# Patient Record
Sex: Female | Born: 1955 | Race: White | Hispanic: No | Marital: Married | Smoking: Never smoker
Health system: Southern US, Community
[De-identification: ages and names within clinical notes are randomized; demographics above are authoritative.]

## PROBLEM LIST (undated history)

## (undated) DIAGNOSIS — M7660 Achilles tendinitis, unspecified leg: Secondary | ICD-10-CM

## (undated) DIAGNOSIS — K219 Gastro-esophageal reflux disease without esophagitis: Secondary | ICD-10-CM

## (undated) DIAGNOSIS — D649 Anemia, unspecified: Secondary | ICD-10-CM

## (undated) HISTORY — DX: Gastro-esophageal reflux disease without esophagitis: K21.9

## (undated) HISTORY — PX: SHOULDER ARTHROSCOPY: SHX128

## (undated) HISTORY — DX: Anemia, unspecified: D64.9

## (undated) HISTORY — PX: OTHER SURGICAL HISTORY: SHX169

## (undated) HISTORY — DX: Achilles tendinitis, unspecified leg: M76.60

## (undated) HISTORY — PX: TENDON REPAIR: SHX5111

---

## 1999-05-04 ENCOUNTER — Encounter: Admission: RE | Admit: 1999-05-04 | Discharge: 1999-05-04 | Payer: Self-pay | Admitting: Family Medicine

## 1999-05-04 ENCOUNTER — Encounter: Payer: Self-pay | Admitting: Family Medicine

## 1999-08-26 ENCOUNTER — Encounter: Admission: RE | Admit: 1999-08-26 | Discharge: 1999-08-26 | Payer: Self-pay | Admitting: Internal Medicine

## 1999-08-26 ENCOUNTER — Encounter: Payer: Self-pay | Admitting: Internal Medicine

## 2001-08-05 ENCOUNTER — Encounter: Payer: Self-pay | Admitting: Internal Medicine

## 2001-08-05 ENCOUNTER — Encounter: Admission: RE | Admit: 2001-08-05 | Discharge: 2001-08-05 | Payer: Self-pay | Admitting: Internal Medicine

## 2004-04-25 ENCOUNTER — Ambulatory Visit: Payer: Self-pay | Admitting: Internal Medicine

## 2004-05-02 ENCOUNTER — Ambulatory Visit: Payer: Self-pay

## 2004-08-11 ENCOUNTER — Encounter: Admission: RE | Admit: 2004-08-11 | Discharge: 2004-08-11 | Payer: Self-pay | Admitting: Internal Medicine

## 2005-01-10 ENCOUNTER — Other Ambulatory Visit: Admission: RE | Admit: 2005-01-10 | Discharge: 2005-01-10 | Payer: Self-pay | Admitting: Obstetrics and Gynecology

## 2005-08-09 ENCOUNTER — Encounter: Admission: RE | Admit: 2005-08-09 | Discharge: 2005-08-09 | Payer: Self-pay | Admitting: Internal Medicine

## 2005-10-16 ENCOUNTER — Ambulatory Visit: Payer: Self-pay | Admitting: Family Medicine

## 2007-01-25 ENCOUNTER — Encounter: Payer: Self-pay | Admitting: Internal Medicine

## 2007-01-25 ENCOUNTER — Encounter: Admission: RE | Admit: 2007-01-25 | Discharge: 2007-01-25 | Payer: Self-pay | Admitting: Internal Medicine

## 2007-08-08 ENCOUNTER — Encounter: Admission: RE | Admit: 2007-08-08 | Discharge: 2007-08-08 | Payer: Self-pay | Admitting: Internal Medicine

## 2008-03-05 ENCOUNTER — Telehealth (INDEPENDENT_AMBULATORY_CARE_PROVIDER_SITE_OTHER): Payer: Self-pay | Admitting: *Deleted

## 2008-03-05 ENCOUNTER — Ambulatory Visit: Payer: Self-pay | Admitting: Sports Medicine

## 2008-03-05 DIAGNOSIS — M7660 Achilles tendinitis, unspecified leg: Secondary | ICD-10-CM | POA: Insufficient documentation

## 2008-03-05 HISTORY — DX: Achilles tendinitis, unspecified leg: M76.60

## 2008-03-06 ENCOUNTER — Encounter: Payer: Self-pay | Admitting: Sports Medicine

## 2008-03-06 ENCOUNTER — Telehealth (INDEPENDENT_AMBULATORY_CARE_PROVIDER_SITE_OTHER): Payer: Self-pay | Admitting: *Deleted

## 2008-04-10 ENCOUNTER — Ambulatory Visit: Payer: Self-pay | Admitting: Sports Medicine

## 2008-05-08 ENCOUNTER — Ambulatory Visit: Payer: Self-pay | Admitting: Sports Medicine

## 2008-06-08 ENCOUNTER — Telehealth (INDEPENDENT_AMBULATORY_CARE_PROVIDER_SITE_OTHER): Payer: Self-pay | Admitting: *Deleted

## 2008-06-12 ENCOUNTER — Ambulatory Visit: Payer: Self-pay | Admitting: Sports Medicine

## 2008-08-06 ENCOUNTER — Encounter: Admission: RE | Admit: 2008-08-06 | Discharge: 2008-08-06 | Payer: Self-pay | Admitting: Internal Medicine

## 2009-01-13 ENCOUNTER — Ambulatory Visit: Payer: Self-pay | Admitting: Sports Medicine

## 2009-02-25 ENCOUNTER — Ambulatory Visit: Payer: Self-pay | Admitting: Sports Medicine

## 2009-02-25 DIAGNOSIS — M7750 Other enthesopathy of unspecified foot: Secondary | ICD-10-CM | POA: Insufficient documentation

## 2009-02-25 DIAGNOSIS — M216X9 Other acquired deformities of unspecified foot: Secondary | ICD-10-CM | POA: Insufficient documentation

## 2009-02-25 DIAGNOSIS — R269 Unspecified abnormalities of gait and mobility: Secondary | ICD-10-CM | POA: Insufficient documentation

## 2009-04-12 ENCOUNTER — Ambulatory Visit: Payer: Self-pay | Admitting: Sports Medicine

## 2009-05-10 ENCOUNTER — Ambulatory Visit: Payer: Self-pay | Admitting: Sports Medicine

## 2009-06-23 ENCOUNTER — Ambulatory Visit: Payer: Self-pay | Admitting: Sports Medicine

## 2009-08-12 ENCOUNTER — Encounter: Admission: RE | Admit: 2009-08-12 | Discharge: 2009-08-12 | Payer: Self-pay | Admitting: Internal Medicine

## 2009-08-12 LAB — HM MAMMOGRAPHY: HM Mammogram: NEGATIVE

## 2010-03-06 ENCOUNTER — Encounter: Payer: Self-pay | Admitting: Internal Medicine

## 2010-03-15 NOTE — Assessment & Plan Note (Signed)
Summary: RT LEG/HEEL,MC   Vital Signs:  Patient profile:   55 year old female BP sitting:   105 / 73  Vitals Entered By: Lillia Pauls CMA (January 13, 2009 10:02 AM)  History of Present Illness: Reports to f/u achilles tendonosis. Left achilles tendonosis resolved with voltaren and NTG patches. Insidiously developed right achilles pain. Performing heel raises as instructed. Using comforthotics. Not sure that she did that much different she does travel and work out regularly she wonders if this is just not starting to arise from regular workouts  Allergies (verified): 1)  ! Ampicillin 2)  ! Erythromycin  Physical Exam  General:  Well-developed,well-nourished,in no acute distress; alert,appropriate and cooperative throughout examination Msk:  Ankles/Feet: - Slightly limited right dorsiflexion 2/2 AT tightness; otherwise FROM. 5/5 strength. - Bilateral Pes Cavus. - ttp at right AT insertion with mildly increased warmth.  No discoloration or defects. - Normal neurovascular exam. Additional Exam:  Musculoskeletal Ultrasound: Longitudinal and transverse views of the achilles tendon revealed the following: Integrity:  Thickness: Retrocalcaneal Bursa: Calcaneus:    Impression & Recommendations:  Problem # 1:  ACHILLES BURSITIS OR TENDINITIS (ICD-726.71)  - Per patient instructions. - RTC in 1 month.   if no improvement would go to NTG s she did well on this before  also - unable to scan today as computer diffculty will scan on [facility] visit if she gets a chance to come back by  Complete Medication List: 1)  Voltaren 1 % Gel (Diclofenac sodium) .... Aaa 4 grams qid 2)  Minitran 0.2 Mg/hr Pt24 (Nitroglycerin) .... 1/4 of a patch on affected area for 12 hours, then off for 12 hours  Patient Instructions: 1)  Daily Exercises: 2)  1. Heel raises.  3)  2. Heel walk. 4)  3. Reverse Heel Walk. 5)  4. Pidgeon-toe walk. 6)  Medications: 7)  1. Voltaren Gel to the affected  area 4 times per day. 8)  Ice: 9)  1. Ice the achilles for 20 minutes at the end of each day.

## 2010-03-15 NOTE — Progress Notes (Signed)
Summary: medication  Phone Note Call from Patient Call back at [phone]   Caller: Patient Summary of Call: Script for medication prescribed today is not at pharmacy. Initial call taken by: Levada Schilling,  March 05, 2008 1:51 PM    New/Updated Medications: VOLTAREN 1 % GEL (DICLOFENAC SODIUM) aaa 4 grams qid   Prescriptions: VOLTAREN 1 % GEL (DICLOFENAC SODIUM) aaa 4 grams qid  #100grms x 2   Entered by:   Lillia Pauls CMA   Authorized by:   Enid Baas MD   Signed by:   Lillia Pauls CMA on 03/05/2008   Method used:   Electronically to        [facility]  [phone]* (retail)       [address]       [place], [place]  [address]       Ph: [phone] or [phone]       Fax: [phone]   RxID:   [id number] VOLTAREN 1 % GEL (DICLOFENAC SODIUM) aaa 4 grams qid  #100grms x 2   Entered by:   Lillia Pauls CMA   Authorized by:   Enid Baas MD   Signed by:   Lillia Pauls CMA on 03/05/2008   Method used:   Historical   RxID:   [id number]

## 2010-03-15 NOTE — Letter (Signed)
Summary: murphy/wainer  murphy/wainer   Imported ByLevada Schilling 03/06/2008 09:16:47  _____________________________________________________________________  External Attachment:    Type:   Image     Comment:   External Document

## 2010-03-15 NOTE — Assessment & Plan Note (Signed)
Summary: FU LEFT ACHILLES TENDON/JW   Vital Signs:  Patient Profile:   55 Years Old Female Pulse rate:   69 / minute BP sitting:   110 / 75  Vitals Entered By: Lillia Pauls CMA (April 10, 2008 8:42 AM)                 Chief Complaint:  FU L ACHILLES .  History of Present Illness: f/u L achilles tendonitis with partial tear Using NTG patches x 1 month without side effects voltaren gel very helpful with pain overall somewhat improved doing eccentric exercises without difficulty    Prior Medication List:  VOLTAREN 1 % GEL (DICLOFENAC SODIUM) aaa 4 grams qid MINITRAN 0.2 MG/HR PT24 (NITROGLYCERIN) 1/4 of a patch on affected area for 12 hours, then off for 12 hours   Current Allergies: No known allergies       Physical Exam  General:     Well-developed,well-nourished,in no acute distress; alert,appropriate and cooperative throughout examination Msk:     L achilles tendon with mild TTP and thickening over insertion. No obvious swelling or defect.  Negative thompson test. Full motion and strength with ankle PF and DF  R achilles tendon with no TTP over insertion. No obvious swelling or defect.  Negative thompson test. Full motion and strength with ankle PF and DF  gait with high impact forefoot strike   Pulses:     2+ dp pulses Extremities:     both feet with pes cavus, mild transverse arch collapse with 4th/5th toes turned in.  early morton's callus bilaterally. No hallux rigidis.  Post tibialis function intact bilaterally. Additional Exam:     MSK u/s- L AT thickness 0.61 (unchanged from previous).  Decrease in size of partial tear, improved neovascularization.  Minimal swelling of retrocalcaneal bursa. Visualized on longitudinal and transverse views.   R AT thickness 0.36.    Impression & Recommendations:  Problem # 1:  ACHILLES BURSITIS OR TENDINITIS (ICD-726.71) Assessment: Unchanged Will con't NTG patches, voltaren as needed.  Will gradually  transition from two legged achilles tendon exercises to one leg as outlined in patient instructions. Sport insoles given for pes cavus to provide increased arch support. She may require custom orthotics down the road if not improving.  F/u for repeat evaluation and repeat u/s in one month. Orders: Sports Insoles [phone])   Complete Medication List: 1)  Voltaren 1 % Gel (Diclofenac sodium) .... Aaa 4 grams qid 2)  Minitran 0.2 Mg/hr Pt24 (Nitroglycerin) .... 1/4 of a patch on affected area for 12 hours, then off for 12 hours   Patient Instructions: 1)  Begin doing 1 set of exercises on L leg only (15 reps) straight, and 1 set of 15 with knee bent.  If doing ok, after 1 week increase to 2 sets, and if doing well for another week, increase to 3 sets. 2)  Do exercises 2x daily if possible 3)  continue voltaren gel as needed 4)  continue nitroglycerin patches daily 5)  follow up in 1 month

## 2010-03-15 NOTE — Assessment & Plan Note (Signed)
Summary: F/U,MC   Vital Signs:  Patient profile:   55 year old female BP sitting:   96 / 59  Vitals Entered By: Lillia Pauls CMA (Jun 23, 2009 11:01 AM)  History of Present Illness: no pain in past mo off NTG x 2 wks mainly walks would like to start hills on TM  has been active w no pain during work using the custom orthotics in shoes for everyday walking and bought some OTC Dr Margart Sickles for other shoes  Allergies: 1)  ! Ampicillin 2)  ! Erythromycin  Physical Exam  General:  Well-developed,well-nourished,in no acute distress; alert,appropriate and cooperative throughout examination Msk:  Cavus shape to feet no TTP of RT or LT AT no swelling no nodules  good foot motion x some limit to dorsiflexion Additional Exam:  MSK [place] insertional tear noted before looks completely healed on both long and trans scan there is some denser tissue consistent w repair no excess fluid in bursa doppler is norm width is now 0.45 and this is less than left which is 0.50  images saved   Impression & Recommendations:  Problem # 1:  ACHILLES BURSITIS OR TENDINITIS (ICD-726.71)  this looks to be 98% healed or better both clinically and on [place]  keep up exercises 3x wk gradually advance exercise and hill walking  reck prn  Orders: [place] LIMITED ([id number])  Problem # 2:  CAVUS DEFORMITY OF FOOT, 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 Aaa 4 grams qid 2)  Minitran 0.2 Mg/hr Pt24 (Nitroglycerin) .... 1/4 of a patch on affected area for 12 hours, then off for 12 hours

## 2010-03-15 NOTE — Assessment & Plan Note (Signed)
Summary: FU ACHILLES TENDON/JW   Vital Signs:  Patient profile:   55 year old female Height:      62 inches Weight:      140 pounds BMI:     25.70 Pulse rate:   84 / minute BP sitting:   101 / 69  Vitals Entered By: Lillia Pauls CMA (May 08, 2008 8:46 AM)  History of Present Illness: 2 month f/u of AT on left doing much better now no tightness going up steps  exercises are on 1 foot good comf with temp insoles  doing all norm exercises except avoiding hills  no side effects from NTG  Allergies: No Known Drug Allergies  Physical Exam  General:  Well-developed,well-nourished,in no acute distress; alert,appropriate and cooperative throughout examination Msk:  Left AT without any pain on palpation no swelling good calf strength  Repeat MS [place] Left AT Thickness on long view is down to 0.46 from 0.61 neovasularity is down from high to moderate to low now partial tear appears totally healed on scan Trans view shows norm width  Note Rt AT is 0.39 on long view    Impression & Recommendations:  Problem # 1:  ACHILLES BURSITIS OR TENDINITIS (ICD-726.71) much improved cont exerc  see isntrucitons  rtc 4 wks  Complete Medication List: 1)  Voltaren 1 % Gel (Diclofenac sodium) .... Aaa 4 grams qid 2)  Minitran 0.2 Mg/hr Pt24 (Nitroglycerin) .... 1/4 of a patch on affected area for 12 hours, then off for 12 hours  Patient Instructions: 1)  your ultrasound shows signs of healing and improvement 2)  we want you to continue nitroglycerin patches for 1 more month 3)  continue your exercises 4)  f/u in 1 month

## 2010-03-15 NOTE — Letter (Signed)
Summary: *Consult Note  [facility]  [address]   [place], [place] [address]   Phone: [phone]  Fax: [phone]    Re:    Heather Huynh DOB:    September 13, 1955 Salvatore Marvel, MD [facility] [address] [place], [place] [address] Fax: [phone]   Dear Nadine Counts:    Thank you for requesting that we see the above patient for consultation.  A copy of the detailed office note will be sent under separate cover, for your review.  Evaluation today is consistent with: Chronic Achilles tedonosis and bursitis   She is essentially back to normal function and we released her today.  Impressively even though she had had symptoms for about 1 year within 6 weeks of NTG patches and eccentric exercise her tendon swelling had almost resolved and her pain level was dramatically decreased.    New Orders include:  1)  Est. Patient Level III [59563] 2)  US EXTREMITY NON-VASC REAL-TIME IMG [87564]   New Medications started today include: May stop both medications and use prn   After today's visit, the patients current medications include: 1)  VOLTAREN 1 % GEL (DICLOFENAC SODIUM) aaa 4 grams qid 2)  MINITRAN 0.2 MG/HR PT24 (NITROGLYCERIN) 1/4 of a patch on affected area for 12 hours, then off for 12 hours   Thank you for this consultation.  If you have any further questions regarding the care of this patient, please do not hesitate to contact me @ [phone].  Thank you for this opportunity to look after your patient.  Sincerely,  Vincent Gros MD

## 2010-03-15 NOTE — Assessment & Plan Note (Signed)
Summary: FU L ACHILLES   Vital Signs:  Patient profile:   55 year old female Height:      62 inches Weight:      138.31 pounds BP sitting:   112 / 60  Vitals Entered By: Lillia Pauls CMA (June 12, 2008 8:38 AM)  History of Present Illness: S: 55 y/o female here to f/u left achilles tendonosis.  She continues to improve and is 10-15 % better compared to her last visit. She has occasional tightness, but no pain really. She is doing all her desired activities expect walking on treadmill at incline. She is using the NTG patches and gets some minor flushing with this, but it is not bothersome.  She continues to do her exercises regularly on 1 foot  Allergies: No Known Drug Allergies  Physical Exam  General:  Well-developed,well-nourished,in no acute distress; alert,appropriate and cooperative throughout examination Msk:  Left AT without any pain on palpation no swelling good calf strength  Repeat MS [place] Left AT Thickness on long view is down to 0.52 and 0.46 on unaffected side neovasularity is low partial tear appears totally healed on scan Trans view shows norm width     Impression & Recommendations:  Problem # 1:  ACHILLES BURSITIS OR TENDINITIS (ICD-726.71) Assessment Improved  Patient much better overall with her left achilles tendonosis. We will have her stop the NTG patches. Continue exercises. Return to exercising at an incline if desired. f/u prn  (see patient instructions)  Orders: US EXTREMITY NON-VASC REAL-TIME IMG (91478)  Complete Medication List: 1)  Voltaren 1 % Gel (Diclofenac sodium) .... Aaa 4 grams qid 2)  Minitran 0.2 Mg/hr Pt24 (Nitroglycerin) .... 1/4 of a patch on affected area for 12 hours, then off for 12 hours  Patient Instructions: 1)  stop the Nitroglycerin patches 2)  do the exercises at least 3 times a week 3)  if using incline, do it for only a few min at 2-3 %, then go back flat again 4)  f/u with [place] as needed!

## 2010-03-15 NOTE — Letter (Signed)
Summary: *Consult Note  [facility]  [address]   [place], [place] [address]   Phone: [phone]  Fax: [phone]    Re:    MARELIN TAT DOB:    Jun 20, 1955 Salvatore Marvel, MD [facility] [address] [place], [place] [address] Fax: [phone]   Dear Nadine Counts:    Thank you for requesting that we see the above patient for consultation.  A copy of the detailed office note will be sent under separate cover, for your review.  Follow up for you:  She is almost totally healed both clinically and by [place] after only 2 mos of NTG patches at lower dose.   After today's visit, the patients current medications include: 1)  VOLTAREN 1 % GEL (DICLOFENAC SODIUM) now using only once daily 2)  MINITRAN 0.2 MG/HR PT24 (NITROGLYCERIN) 1/4 of a patch on affected area for 12 hours, then off for 12 hours   Thank you for this consultation.  If you have any further questions regarding the care of this patient, please do not hesitate to contact me @ [phone].  Thank you for this opportunity to look after your patient.  Sincerely,  Vincent Gros MD

## 2010-03-15 NOTE — Assessment & Plan Note (Signed)
Summary: NP [place] ACHILLES TENDON LEFT/MJD   Vital Signs:  Patient Profile:   55 Years Old Female Weight:      140.13 pounds Pulse rate:   71 / minute BP sitting:   110 / 62  Vitals Entered By: Lillia Pauls CMA (March 05, 2008 8:54 AM)                 Chief Complaint:  np per wainer for left achilles ultrasound.  History of Present Illness: Physicican Requesting Consultation:  Salvatore Marvel Reason for Consultation:  L achilles tendonitis  55 yo female with hx of L achilles tendon pain x 1 yr. Gradual onset without discrete injury.  She has been followed for this by dr. Thurston Hole, and has done PT with eccentric exercises, iontophoresis, heel cups, none of which have resulted in improvement.  She does note some relief while wearing flector patches.  Notes pain with walking and running, which are her preferred forms of exercise.    Prior Medication List:  No prior medications documented  Current Allergies: No known allergies   Past Medical History:    Unremarkable   Social History:    works as Academic librarian    Review of Systems       denies any numbness, tingling, weakness   Physical Exam  General:     Well-developed,well-nourished,in no acute distress; alert,appropriate and cooperative throughout examination Msk:     L achilles tendon with mild TTP over insertion. No obvious swelling or defect.  Negative thompson test. Full motion and strength with ankle PF and DF  R achilles tendon with no TTP over insertion. No obvious swelling or defect.  Negative thompson test. Full motion and strength with ankle PF and DF  Leg lengths equal, 4+/5  hip abductor/adductor strength Extremities:     both feet with pes cavus, mild transverse arch collapse with 4th/5th toes turned in.  early morton's callus bilaterally. No hallux rigidis.  Post tibialis function intact bilaterally. Neurologic:     intact sensation Skin:     no lesions or rashes Additional Exam:  MSK u/s- L achilles with partial tearing and calcification near the inseration, visible on longitudinal and transverse views., significant edema in retrocalcaneal bursa. Tendon thickness 0.62 cm at insertion.  + neovessel near tear  R achilles with no tearing or edema. Thickness 0.35 cm at insertion.  No neovascularization.  Images have been saved to computer and are available for review.    Impression & Recommendations:  Problem # 1:  ACHILLES BURSITIS OR TENDINITIS (ICD-726.71) Assessment: New Achilles tendon eccentric strecthing exercises (as described in handout).  Ice as needed and after exercises. Will begin nitroglycerin treatment with 0.2 mg patch, 1/4 patch on achilles tendon for 12 hours, then off for 12 hours.  She will return for follow up and repeat u/s in 1 month. Orders: US EXTREMITY NON-VASC REAL-TIME IMG [phone])

## 2010-03-15 NOTE — Progress Notes (Signed)
Summary: medicATION  Phone Note Call from Patient Call back at [phone]   Caller: Patient Summary of Call: Patient picked up medication at pharmacy.  She was expecting a nitroglycerin patch for her achilles, but the script was for a gel.  Requesting return call. Initial call taken by: Levada Schilling,  March 06, 2008 8:28 AM    New/Updated Medications: MINITRAN 0.2 MG/HR PT24 (NITROGLYCERIN) 1/4 of a patch on affected area for 12 hours, then off for 12 hours   Prescriptions: MINITRAN 0.2 MG/HR PT24 (NITROGLYCERIN) 1/4 of a patch on affected area for 12 hours, then off for 12 hours  #10 x 1   Entered by:   Lillia Pauls CMA   Authorized by:   Enid Baas MD   Signed by:   Lillia Pauls CMA on 03/06/2008   Method used:   Electronically to        [facility]  [phone]* (retail)       [address]       [place], [place]  [address]       Ph: [phone] or [phone]       Fax: [phone]   RxID:   [id number]

## 2010-03-15 NOTE — Assessment & Plan Note (Signed)
Summary: F/U ACHILLES TENDON,MC   Vital Signs:  Patient profile:   55 year old female Height:      62 inches Weight:      140 pounds BMI:     25.70 BP sitting:   124 / 74  Vitals Entered By: Lillia Pauls CMA (February 25, 2009 11:21 AM)  History of Present Illness: 55 yo F here for f/u R achilles tendinosis (remote h/o L but now completely resolved)  R achilles tendinosis has worsened since last visit. Not improving with exercises, comforthotics, voltaren, icing. Compliant with her exercises. No obvious injury or ramping up of activity prior to onset/worsening of achilles issues Is insertional. No swelling/bruising.  Medications Prior to Update: 1)  Voltaren 1 % Gel (Diclofenac Sodium) .... Aaa 4 Grams Qid 2)  Minitran 0.2 Mg/hr Pt24 (Nitroglycerin) .... 1/4 of A Patch On Affected Area For 12 Hours, Then Off For 12 Hours  Allergies (verified): 1)  ! Ampicillin 2)  ! Erythromycin  Physical Exam  General:  Well-developed,well-nourished,in no acute distress; alert,appropriate and cooperative throughout examination Msk:  Ankles/Feet: 5 degrees of dorsiflexion bilateral feet. No gross deformity.  No haglunds deformity Mild TTP insertion of achilles on R.  No TTP on L. Bilateral pes cavus Leg lengths equal No hallux valgus/rigidus.  Additional Exam:  MSK [place]  On this occassion she has a separation at insertion of RT AT and marked bursal swelling and neovascular change in this area ON trans scan  ~ 30% tendon volume involved in tear Both tendons are borderline thickened at  ~ 0.58cms  images saved    Impression & Recommendations:  Problem # 1:  ACHILLES BURSITIS OR TENDINITIS (ICD-726.71) Assessment Deteriorated  Restart nitro patches for right achilles.  Placed in custom orthotics today.  Patient was fitted for a : standard, cushioned, semi-rigid orthotic. The orthotic was heated and afterward the patient stood on the orthotic blank positioned on the orthotic  stand. The patient was positioned in subtalar neutral position and 10 degrees of ankle dorsiflexion in a weight bearing stance. After completion of molding, a stable base was applied to the orthotic blank. The blank was ground to a stable position for weight bearing. 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  Orders: [employer], each unit [phone])  Problem # 4:  ABNORMALITY OF GAIT (ICD-781.2) Assessment: New  Orthotics lead to more neutral gait.  Orders: [employer], each unit [phone])  Complete Medication List: 1)  Voltaren 1 % Gel (Diclofenac sodium) .... Aaa 4 grams qid 2)  Minitran 0.2 Mg/hr Pt24 (Nitroglycerin) .... 1/4 of a patch on affected area for 12 hours, then off for 12 hours  Patient Instructions: 1)  Restart the nitroglycerin patches for the right heel this time. 2)  Wear the orthotics regularly - take out a lace in the middle of the shoe if you feel they are too tight. 3)  Follow up with [place] in 6 weeks. Prescriptions: MINITRAN 0.2 MG/HR PT24 (NITROGLYCERIN) 1/4 of a patch on affected area for 12 hours, then off for 12 hours  #1 box x 1   Entered and Authorized by:  Enid Baas MD   Signed by:   Enid Baas MD on 02/25/2009   Method used:   Electronically to        [facility]  [phone]* (retail)       [address]       [place], [place]  [address]       Ph: [phone] or [phone]       Fax: [phone]   RxID:   [id number]

## 2010-03-15 NOTE — Assessment & Plan Note (Signed)
Summary: F/U,MC   Vital Signs:  Patient profile:   55 year old female BP sitting:   103 / 70  Vitals Entered By: Lillia Pauls CMA (May 10, 2009 8:44 AM)  History of Present Illness: For past week she has had no pain in RT AT states that she has forgotten the patch a day ot two without any inc in pain cont to do the exercises able to do them on 1 leg  no probs with NTG cont to exercise b ut less running using orthotics  Allergies: 1)  ! Ampicillin 2)  ! Erythromycin  Physical Exam  General:  Well-developed,well-nourished,in no acute distress; alert,appropriate and cooperative throughout examination Msk:  RT AT no swelling non tender good calf strength on assessment  LT AT by comparison is similar in size and strength Additional Exam:  MSK [place] RT AT is now 0.49 which is   ~ 15% decrease marginal tear at deep insertion is 80% less on long and trans scan neovessels have completely resolved  images stored   Impression & Recommendations:  Problem # 1:  ACHILLES BURSITIS OR TENDINITIS (ICD-726.71)  looks much better cont exercises cont NTG  If progress cont and scan shows complete healing in 1 mo we can start her inc her running and workouts  Orders: [place] LIMITED ([id number])  Problem # 2:  CAVUS DEFORMITY OF FOOT, 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 Aaa 4 grams qid 2)  Minitran 0.2 Mg/hr Pt24 (Nitroglycerin) .... 1/4 of a patch on affected area for 12 hours, then off for 12 hours

## 2010-03-15 NOTE — Assessment & Plan Note (Signed)
Summary: F/U ACHILLIES TENDON,MC   Vital Signs:  Patient profile:   55 year old female BP sitting:   122 / 84  Vitals Entered By: Heather Huynh (April 12, 2009 9:01 AM)  CC:  f/u R achilles.  History of Present Illness: 55 yo F here for f/u R achilles tendinosis (remote h/o L but now completely resolved)  R achilles tendinosis has not improved/slightly worsened since last visit. Not improving with exercises, custom orthotics, nitro patch, voltaren, icing Compliant with her exercises. No obvious injury or ramping up of activity prior to onset/worsening of achilles issues Is insertional. No swelling/bruising.  she has been able to do exercises on 1 leg does not get increased pain and in fact feels a bit better after this  Current Medications (verified): 1)  Voltaren 1 % Gel (Diclofenac Sodium) .... Aaa 4 Grams Qid 2)  Minitran 0.2 Mg/hr Pt24 (Nitroglycerin) .... 1/4 of A Patch On Affected Area For 12 Hours, Then Off For 12 Hours  Allergies: 1)  ! Ampicillin 2)  ! Erythromycin  Physical Exam  General:  Well-developed,well-nourished,in no acute distress; alert,appropriate and cooperative throughout examination Msk:  Ankles/Feet: 5 degrees of dorsiflexion bilateral feet. No gross deformity.  No haglunds deformity no TTP of insertion of achilles bilaterally Bilateral pes cavus Leg lengths equal No hallux valgus/rigidus.  Additional Exam:  MSK UIS Scan today shows a partial separation of AT at deep insertion  into heel trans view shows tear is only 10% or so of fibers the tendon is only mildly thick  0.56 cm AP RT vs 0.59 cm LT neovessels and swelling around retro calcaneal bursa  images saved   Impression & Recommendations:  Problem # 1:  ACHILLES BURSITIS OR TENDINITIS (ICD-726.71)  increase nitro patch to 1/2 patch daily.  cont exercises ice after activity activity to tolerance reck 1 month  Orders: [place] LIMITED ([id number])  Complete Medication List: 1)   Voltaren 1 % Gel (Diclofenac sodium) .... Aaa 4 grams qid 2)  Minitran 0.2 Mg/hr Pt24 (Nitroglycerin) .... 1/4 of a patch on affected area for 12 hours, then off for 12 hours

## 2010-03-15 NOTE — Progress Notes (Signed)
Summary: nitro patch  Phone Note Call from Patient Call back at [phone]   Caller: Patient Reason for Call: Talk to Nurse Summary of Call: Requesting new nitroglycerin patch.  Initial call taken by: Levada Schilling,  June 08, 2008 10:49 AM      Prescriptions: MINITRAN 0.2 MG/HR PT24 (NITROGLYCERIN) 1/4 of a patch on affected area for 12 hours, then off for 12 hours  #1 box x 1   Entered by:   Lillia Pauls CMA   Authorized by:   Enid Baas MD   Signed by:   Lillia Pauls CMA on 06/08/2008   Method used:   Electronically to        [facility]  [phone]* (retail)       [address]       [place], [place]  [address]       Ph: [phone] or [phone]       Fax: [phone]   RxID:   [id number]

## 2010-03-15 NOTE — Letter (Signed)
Summary: *Consult Note  [facility]  [address]   [place], [place] [address]   Phone: [phone]  Fax: [phone]    Re:    Heather Huynh DOB:    January 30, 1956   Dear Dr. Thurston Hole:    Thank you for requesting that we see the above patient for consultation.  A copy of the detailed office note will be sent under separate cover, for your review.  Evaluation today is consistent with: L achilles tendonosis, partial achilles tendon tear visualized on ultrasound   Our recommendation is for: Eccentric stretching exercises, icing.  We have started nitroglycerin treatment with nitroglycerin 0.2 mg/hr patches, 1/4 patch on for 12 hours, off for 12 hours. We will follow up with her next month for repeat ultrasound.     New Medications started today include: nitroglycerin 0.2 mg/hr patches, 1/4 patch on for 12 hours, off for 12 hours.      Thank you for this consultation.  If you have any further questions regarding the care of this patient, please do not hesitate to contact me @ [phone]  Thank you for this opportunity to look after your patient.  Sincerely,   Enid Baas MD

## 2010-06-23 ENCOUNTER — Encounter: Payer: Self-pay | Admitting: Family Medicine

## 2010-06-24 ENCOUNTER — Ambulatory Visit: Payer: Self-pay | Admitting: Family Medicine

## 2010-07-01 NOTE — Assessment & Plan Note (Signed)
[facility]                                   ON-CALL NOTE   Heather Huynh, Heather Huynh                     MRN:          [id number]  DATE:10/16/2005                            DOB:          07-30-1955    OUTPATIENT ON CALL NOTE:  Date of interaction is October 16, 2005 at 8:22.  Patient's phone number is [phone].   OBJECTIVE:  The patient has burning and dysuria.  Is uncomfortable.  Has had  urinary infections in the past and would like a prescription for Cipro  called in.   ASSESSMENT:  Possible UTI.   PLAN:  Come into the office to give a urine sample and be seen.  Will assess  at that time.   PRIMARY CARE PHYSICIAN:  Dr. Artist Pais.  Home office is [place].                                   Arta Silence, MD   RNS/MedQ  DD:  10/16/2005  DT:  10/16/2005  Job #:  [id number]   cc:   Barbette Hair. Artist Pais, DO

## 2010-07-19 ENCOUNTER — Other Ambulatory Visit: Payer: Self-pay | Admitting: Internal Medicine

## 2010-07-19 DIAGNOSIS — Z1231 Encounter for screening mammogram for malignant neoplasm of breast: Secondary | ICD-10-CM

## 2010-07-28 ENCOUNTER — Ambulatory Visit
Admission: RE | Admit: 2010-07-28 | Discharge: 2010-07-28 | Disposition: A | Discharge status: Home / Self Care | Payer: BC Managed Care – PPO | Source: Ambulatory Visit | Attending: Internal Medicine | Admitting: Internal Medicine

## 2010-07-28 DIAGNOSIS — Z1231 Encounter for screening mammogram for malignant neoplasm of breast: Secondary | ICD-10-CM

## 2011-07-03 ENCOUNTER — Other Ambulatory Visit: Payer: Self-pay | Admitting: Internal Medicine

## 2011-07-03 DIAGNOSIS — Z1231 Encounter for screening mammogram for malignant neoplasm of breast: Secondary | ICD-10-CM

## 2011-07-27 ENCOUNTER — Ambulatory Visit
Admission: RE | Admit: 2011-07-27 | Discharge: 2011-07-27 | Disposition: A | Discharge status: Home / Self Care | Payer: BC Managed Care – PPO | Source: Ambulatory Visit | Attending: Internal Medicine | Admitting: Internal Medicine

## 2011-07-27 DIAGNOSIS — Z1231 Encounter for screening mammogram for malignant neoplasm of breast: Secondary | ICD-10-CM

## 2011-10-09 ENCOUNTER — Encounter: Payer: Self-pay | Admitting: Sports Medicine

## 2011-10-09 ENCOUNTER — Ambulatory Visit (INDEPENDENT_AMBULATORY_CARE_PROVIDER_SITE_OTHER): Payer: BC Managed Care – PPO | Admitting: Sports Medicine

## 2011-10-09 VITALS — BP 112/68 | HR 62 | Ht 62.0 in | Wt 135.0 lb

## 2011-10-09 DIAGNOSIS — M7918 Myalgia, other site: Secondary | ICD-10-CM | POA: Insufficient documentation

## 2011-10-09 DIAGNOSIS — M216X9 Other acquired deformities of unspecified foot: Secondary | ICD-10-CM

## 2011-10-09 DIAGNOSIS — IMO0001 Reserved for inherently not codable concepts without codable children: Secondary | ICD-10-CM

## 2011-10-09 NOTE — Assessment & Plan Note (Addendum)
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 She was encouraged to stretch hamstrings and hip abductors bilaterally. Also, told to use a thera-band for hip abduction strengthening with 3 set of 15 daily. She will also try Voltaren for topical relief. She has some already, so no new prescription was given. Follow up in 3 weeks.

## 2011-10-09 NOTE — Assessment & Plan Note (Signed)
The patient will return with her athletic shoes for fitting of orthotics.

## 2011-10-09 NOTE — Progress Notes (Signed)
  Subjective:    Patient ID: Heather Huynh, female    DOB: June 04, 1955, 56 y.o.   MRN: [id number]  HPI  56 year old female who present with left buttocks pain. The pain started without known injury or trauma 5-6 months ago and is worsening. The pain is exacerbated by prolonged sitting and hamstring exercises. It is relieved by stretching. She has a history of pyriformis syndrome and notes that this pain is similar but worse. She denies shooting pain, numbness or tingling.   Review of Systems  All other systems reviewed and are negative.       Objective:   Physical Exam BP 112/68  Pulse 62  Ht 5\' 2"  (1.575 m)  Wt 135 lb (61.236 kg)  BMI 24.69 kg/m2 Gen: well appearing middle aged female, non distressed, pleasant and conversant  MSK: TTP in left medial and lateral buttocks, 4/5 strength of left hip abduction compared to 5/5 strength of abduction on right hip, 40 degrees external rotation and 20 degrees of internal rotation of left hip; 30 degrees external rotation of left hip and 20 degrees of internal rotation of the left hip;  Neuro: negative straight leg raise on left, 2+ patellar DTR bilaterally       Assessment & Plan:  56 year old with left buttock pain most likely caused by an injury to the pyriformis.

## 2011-10-30 ENCOUNTER — Ambulatory Visit (INDEPENDENT_AMBULATORY_CARE_PROVIDER_SITE_OTHER): Payer: BC Managed Care – PPO | Admitting: Sports Medicine

## 2011-10-30 ENCOUNTER — Ambulatory Visit
Admission: RE | Admit: 2011-10-30 | Discharge: 2011-10-30 | Disposition: A | Discharge status: Home / Self Care | Payer: BC Managed Care – PPO | Source: Ambulatory Visit | Attending: Sports Medicine | Admitting: Sports Medicine

## 2011-10-30 ENCOUNTER — Encounter: Payer: Self-pay | Admitting: Sports Medicine

## 2011-10-30 ENCOUNTER — Telehealth: Payer: Self-pay | Admitting: Sports Medicine

## 2011-10-30 VITALS — BP 121/78 | HR 65 | Ht 62.0 in | Wt 135.0 lb

## 2011-10-30 DIAGNOSIS — S76319A Strain of muscle, fascia and tendon of the posterior muscle group at thigh level, unspecified thigh, initial encounter: Secondary | ICD-10-CM

## 2011-10-30 DIAGNOSIS — M7918 Myalgia, other site: Secondary | ICD-10-CM

## 2011-10-30 DIAGNOSIS — IMO0002 Reserved for concepts with insufficient information to code with codable children: Secondary | ICD-10-CM

## 2011-10-30 DIAGNOSIS — IMO0001 Reserved for inherently not codable concepts without codable children: Secondary | ICD-10-CM

## 2011-10-30 NOTE — Addendum Note (Signed)
Addended by: Jacki Cones C on: 10/30/2011 05:34 PM   Modules accepted: Orders

## 2011-10-30 NOTE — Telephone Encounter (Signed)
I spoke with the patient today on the phone regarding x-rays of her lumbar spine. AP and lateral views of the lumbar spine show a paucity of degenerative changes. Nothing acute. At this point the patient will proceed with physical therapy. An order has been written for both the pirifomis syndrome as well as possible discogenic pain. I've asked the patient to followup with me at the completion of her physical therapy. She is in agreement with this plan.

## 2011-10-30 NOTE — Progress Notes (Signed)
  Subjective:    Patient ID: Heather Huynh, female    DOB: Dec 06, 1955, 56 y.o.   MRN: [id number]  HPI chief complaint: Followup on left hip pain  Patient comes in today for followup. Still struggling with posterior left hip pain. It is worse with sitting and improves with activity. She denies any radiating pain down her leg but does get some tightness in the proximal hamstring with activity. She has continued to have pain despite home exercise program. Still no groin pain.    Review of Systems     Objective:   Physical Exam Well-developed, well-nourished. No acute distress  Patient has slight tenderness to palpation along the left ear form is but not markedly. No tenderness in the sciatic notch. No muscle spasm. Smooth painless hip range of motion. Negative straight leg raise. No tenderness over the ischial tuberosity. Strength remains 5 out of 5 both lower extremities with reflexes brisk and equal at the Achilles and patellar tendons. She walks without a limp.       Assessment & Plan:  1. Persistent posterior left hip pain secondary to piriformis syndrome versus referred pain from lumbar radiculopathy  AP and lateral x-rays of the lumbar spine. I will call her with those results once available. I would like to try some formal physical therapy at [facility]. If her x-ray show evidence of degenerative disc disease, I will order therapy both for performance syndrome and DDD. A like to see her back in 3-4 weeks for recheck. In the meantime patient can continue with activity as tolerated.

## 2012-01-01 ENCOUNTER — Ambulatory Visit (INDEPENDENT_AMBULATORY_CARE_PROVIDER_SITE_OTHER): Payer: BC Managed Care – PPO | Admitting: Sports Medicine

## 2012-01-01 ENCOUNTER — Encounter: Payer: Self-pay | Admitting: Sports Medicine

## 2012-01-01 VITALS — BP 104/70 | HR 60 | Ht 62.0 in | Wt 135.0 lb

## 2012-01-01 DIAGNOSIS — M7918 Myalgia, other site: Secondary | ICD-10-CM

## 2012-01-01 DIAGNOSIS — IMO0001 Reserved for inherently not codable concepts without codable children: Secondary | ICD-10-CM

## 2012-01-01 NOTE — Assessment & Plan Note (Addendum)
Not improved. Given continued pain despite PT and home exercises, less likely to be piriformis syndrome. Previous lumbar x-rays after last visit without DJD. Symptoms of worse with sitting despite no other radicular symptoms still concerning for discogenic origin so have ordered MRI lumbar spine in 3 weeks. Possible pain is coming from stress of leg length discrepancy so have placed green insoles with heel lift 3/16th inch lift on the right in 2 separate pair of shoes. Patient may call to cancel MRI if symptoms improve with heel lift. Will call with MRI results once available.

## 2012-01-01 NOTE — Progress Notes (Signed)
  Subjective:    Patient ID: Heather Huynh, female    DOB: Nov 30, 1955, 56 y.o.   MRN: [id number]  HPI chief complaint: Followup on left buttocks pain  56F with left buttocks pain over last 9 months with sitting. Associated symptoms include "tight hamstring" which improves with massage or walking. Left buttocks pain has persisted despite formal PT over last few weeks (had home exercise program before that). Patient had plain films of lumbar spine which showed a paucity of DJD. Pain continues to come after patient sits for 45 minutes and resolves with getting up and moving. Continues to deny radiation of pain into legs, numbness, tingling, fecal or urine incontinence, groin pain.   Review of Systems-see HPI     Objective:   Physical Exam Well-developed, well-nourished. No acute distress  Patient has slight tenderness to deep palpation lateral to piriformis. No tenderness over ischial tuberosity. No tenderness in the sciatic notch. No muscle spasm. Good range of motion-able to fully extend and flex neck and low back. Smooth painless hip range of motion and no pain on palpation of greater torchanter. Negative straight leg raise.  Strength remains 5 out of 5 both lower extremities with reflexes brisk and equal at the Achilles and patellar tendons. She walks without a limp. Neurovascularly intact distally.   Patient noted to have right leg 1cm shorter than left leg.      Assessment & Plan:  See problem oriented charting

## 2012-01-05 ENCOUNTER — Telehealth: Payer: Self-pay | Admitting: *Deleted

## 2012-01-05 MED ORDER — NITROGLYCERIN 0.2 MG/HR TD PT24: MEDICATED_PATCH | TRANSDERMAL | Status: DC

## 2012-01-05 NOTE — Telephone Encounter (Signed)
Message copied by Mora Bellman on Fri Jan 05, 2012 11:23 AM ------      Message from: Lizbeth Bark      Created: Fri Jan 05, 2012  9:50 AM      Regarding: MED REQUEST      Contact: [phone]       Is requesting nitro patch called in. pharmacy is [facility].      Please let me know when/if you call in, so I can let patient know. Thanks!

## 2012-01-05 NOTE — Telephone Encounter (Signed)
Per Dr. Margaretha Sheffield, ok to restart NTG for AT.  Scheduled her for f/u 02/19/12 with Dr Margaretha Sheffield.  She will continue doing eccentric exercises.

## 2012-01-22 ENCOUNTER — Ambulatory Visit
Admission: RE | Admit: 2012-01-22 | Discharge: 2012-01-22 | Disposition: A | Discharge status: Home / Self Care | Payer: BC Managed Care – PPO | Source: Ambulatory Visit | Attending: Sports Medicine | Admitting: Sports Medicine

## 2012-01-22 DIAGNOSIS — M7918 Myalgia, other site: Secondary | ICD-10-CM

## 2012-01-23 ENCOUNTER — Other Ambulatory Visit: Payer: BC Managed Care – PPO

## 2012-01-24 ENCOUNTER — Encounter: Payer: Self-pay | Admitting: *Deleted

## 2012-01-24 ENCOUNTER — Telehealth: Payer: Self-pay | Admitting: Sports Medicine

## 2012-01-24 NOTE — Telephone Encounter (Signed)
I spoke with Heather Huynh today on the phone regarding MRI findings of her lumbar spine. She has a focally prominent L4-L5 disc bulge to the left which appears to contact left L4 nerve root. She also has facet degeneration at this level with mild edematous changes. Clinically, I think her symptoms are originating at this level. I recommended consultation Dr Maurice Small to discuss further treatment. She may benefit from a diagnostic/therapeutic lumbar ESI or possibly a facet injection. I will defer further treatment from this point forward to the discretion of Dr. Maurice Small and the patient will f/u with me prn.

## 2012-01-24 NOTE — Patient Instructions (Signed)
APPT WITH DR [facility] IBAZEBO TUES 01/30/12 AT 9AM [address]  [phone] WILL FAX NOTES TO [phone]

## 2012-02-19 ENCOUNTER — Ambulatory Visit: Payer: BC Managed Care – PPO | Admitting: Sports Medicine

## 2012-02-26 ENCOUNTER — Ambulatory Visit (INDEPENDENT_AMBULATORY_CARE_PROVIDER_SITE_OTHER): Payer: BC Managed Care – PPO | Admitting: Sports Medicine

## 2012-02-26 VITALS — BP 90/61 | Ht 62.0 in | Wt 134.0 lb

## 2012-02-26 DIAGNOSIS — G5700 Lesion of sciatic nerve, unspecified lower limb: Secondary | ICD-10-CM

## 2012-02-27 NOTE — Progress Notes (Signed)
  Subjective:    Patient ID: Heather Huynh, female    DOB: [birth year], 57 y.o.   MRN: [id number]  HPI Da comes in today for followup. She saw Dr. Maurice Small on December 19th. Although her MRI scan of her lumbar spine showed a leftward L4-L5 disc bulge, Dr.Ibazebo felt that her problem was more soft tissue in origin. He performed a diagnostic/therapeutic inframedial gluteal injection under x-ray guidance. This alleviated her pain. She had return to exercise and experience a mild returning discomfort late last week but she states that this pain is a little different in nature than what she experienced previously. Her previous pain was associated with some tightness in the hamstring. This most recent episode however localized itself to the left gluteal area. No associated numbness or tingling.   Review of Systems     Objective:   Physical Exam Well-developed, well-nourished. No acute distress. Awake alert oriented x3  There is tenderness to palpation in the left piriformis. No tenderness over the initial tuberosity. Negative straight leg raise. Patient has excellent passive internal rotation of the left hip but external rotation is limited to about 25 compared to the uninvolved right hip which is able to achieve 40 of external rotation. Neurovascular intact distally. Walking without a limp.      Assessment & Plan:  1. Left piriformis syndrome  I've given the patient 3 piriformis stretches to do. She will continue with her small lift in her right shoe for her length discrepancy. Continue with activity as tolerated. If symptoms persist I did discussed the possibility of working with Ellamae Sia. 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 He is pain-free and is very compliant with her home exercises. This point we will have her discontinue the nitroglycerin patch but continue with the exercises. Followup when necessary.

## 2012-06-06 ENCOUNTER — Telehealth: Payer: Self-pay | Admitting: *Deleted

## 2012-06-06 DIAGNOSIS — IMO0001 Reserved for inherently not codable concepts without codable children: Secondary | ICD-10-CM

## 2012-06-06 NOTE — Telephone Encounter (Signed)
Pt called requesting referral to [facility] for PT.

## 2012-07-10 ENCOUNTER — Other Ambulatory Visit: Payer: Self-pay | Admitting: Internal Medicine

## 2012-07-10 DIAGNOSIS — Z1231 Encounter for screening mammogram for malignant neoplasm of breast: Secondary | ICD-10-CM

## 2012-07-23 ENCOUNTER — Ambulatory Visit: Payer: BC Managed Care – PPO

## 2012-07-23 ENCOUNTER — Ambulatory Visit
Admission: RE | Admit: 2012-07-23 | Discharge: 2012-07-23 | Disposition: A | Discharge status: Home / Self Care | Payer: BC Managed Care – PPO | Source: Ambulatory Visit | Attending: Internal Medicine | Admitting: Internal Medicine

## 2012-07-23 DIAGNOSIS — Z1231 Encounter for screening mammogram for malignant neoplasm of breast: Secondary | ICD-10-CM

## 2012-07-24 ENCOUNTER — Other Ambulatory Visit: Payer: Self-pay | Admitting: Internal Medicine

## 2012-07-24 DIAGNOSIS — R928 Other abnormal and inconclusive findings on diagnostic imaging of breast: Secondary | ICD-10-CM

## 2012-08-05 ENCOUNTER — Ambulatory Visit
Admission: RE | Admit: 2012-08-05 | Discharge: 2012-08-05 | Disposition: A | Discharge status: Home / Self Care | Payer: BC Managed Care – PPO | Source: Ambulatory Visit | Attending: Internal Medicine | Admitting: Internal Medicine

## 2012-08-05 DIAGNOSIS — R928 Other abnormal and inconclusive findings on diagnostic imaging of breast: Secondary | ICD-10-CM

## 2012-12-05 ENCOUNTER — Other Ambulatory Visit: Payer: Self-pay | Admitting: *Deleted

## 2012-12-05 DIAGNOSIS — M7918 Myalgia, other site: Secondary | ICD-10-CM

## 2012-12-05 DIAGNOSIS — G5700 Lesion of sciatic nerve, unspecified lower limb: Secondary | ICD-10-CM

## 2013-02-11 ENCOUNTER — Encounter: Payer: Self-pay | Admitting: Family Medicine

## 2013-02-11 ENCOUNTER — Encounter (INDEPENDENT_AMBULATORY_CARE_PROVIDER_SITE_OTHER): Payer: Self-pay

## 2013-02-11 ENCOUNTER — Ambulatory Visit (INDEPENDENT_AMBULATORY_CARE_PROVIDER_SITE_OTHER): Payer: BC Managed Care – PPO | Admitting: Family Medicine

## 2013-02-11 VITALS — BP 104/70 | HR 84 | Ht 62.0 in | Wt 135.0 lb

## 2013-02-11 DIAGNOSIS — M25519 Pain in unspecified shoulder: Secondary | ICD-10-CM

## 2013-02-11 DIAGNOSIS — M25511 Pain in right shoulder: Secondary | ICD-10-CM

## 2013-02-11 MED ORDER — NITROGLYCERIN 0.2 MG/HR TD PT24: MEDICATED_PATCH | TRANSDERMAL | Status: DC

## 2013-02-11 NOTE — Patient Instructions (Signed)
You have infraspinatus tendinitis, possibly a degenerative labral tear based on your exam. Treatment for both is similar. Try to avoid painful activities (overhead activities, lifting with extended arm) as much as possible. Aleve 2 tabs twice a day with food OR ibuprofen 3 tabs three times a day with food for pain and inflammation for 1 week then as needed. Subacromial injection may be beneficial to help with pain and to decrease inflammation if not improving. Do home exercise program with theraband and scapular stabilization exercises daily - these are very important for long term relief even if an injection was given 3 sets of 10 once a day. Add nitro patches if not improving - 1/4th of a patch and change daily. If not improving at follow-up we will consider further imaging, physical therapy, injection, increasing nitro dosage. Follow up in 6 weeks.

## 2013-02-13 ENCOUNTER — Encounter: Payer: Self-pay | Admitting: Family Medicine

## 2013-02-13 DIAGNOSIS — M25511 Pain in right shoulder: Secondary | ICD-10-CM | POA: Insufficient documentation

## 2013-02-13 NOTE — Assessment & Plan Note (Signed)
most consistent with infraspinatus tendinitis, impingement.  May have degenerative labral tear.  Treatment similar initially.  Avoid painful activities.  Start HEP and nitro patches (discussed risks of headaches, skin irritation).  NSAIDs, icing as needed.  Consider injection, increasing nitro dosage, PT if not improving.  F/u in 6 weeks.

## 2013-02-13 NOTE — Progress Notes (Signed)
Patient ID: Heather Huynh, female   DOB: 1955/09/23, 58 y.o.   MRN: [id number]  PCP: No primary provider on file.  Subjective:   HPI: Patient is a 58 y.o. female here for right shoulder pain.  Patient denies known injury. States over past 2 months she has had slowly worsening right shoulder pain. Bothers her with abduction, lying on right side. Is left handed. Has not tried any medications, PT, icing/heat. Doesn't do much lifting, overhead motions at work.  History reviewed. No pertinent past medical history.  Current Outpatient Prescriptions on File Prior to Visit  Medication Sig Dispense Refill  . diclofenac sodium (VOLTAREN) 1 % GEL aaa 4 grams qid.       . Ferrous Sulfate (IRON) 325 (65 FE) MG TABS Take 1 tablet by mouth daily.       No current facility-administered medications on file prior to visit.    History reviewed. No pertinent past surgical history.  Allergies  Allergen Reactions  . Ampicillin   . Erythromycin     History   Social History  . Marital Status: Divorced    Spouse Name: N/A    Number of Children: N/A  . Years of Education: N/A   Occupational History  . Not on file.   Social History Main Topics  . Smoking status: Never Smoker   . Smokeless tobacco: Never Used  . Alcohol Use: Not on file  . Drug Use: Not on file  . Sexual Activity: Not on file   Other Topics Concern  . Not on file   Social History Narrative  . No narrative on file    Family History  Problem Relation Age of Onset  . Sudden death Neg Hx   . Hypertension Neg Hx   . Hyperlipidemia Neg Hx   . Heart attack Neg Hx   . Diabetes Neg Hx     BP 104/70  Pulse 84  Ht 5\' 2"  (1.575 m)  Wt 135 lb (61.236 kg)  BMI 24.69 kg/m2  Review of Systems: See HPI above.    Objective:  Physical Exam:  Gen: NAD  Right shoulder: No swelling, ecchymoses.  No gross deformity. No TTP AC joint, biceps tendon. FROM with pain on ER. Negative Hawkins, Neers. Negative Speeds,  Yergasons. Strength 5/5 with empty can and resisted internal/external rotation. Pain with resisted ER. Negative apprehension. Mild positive o'briens. NV intact distally.    Assessment & Plan:  1. Right shoulder pain - most consistent with infraspinatus tendinitis, impingement.  May have degenerative labral tear.  Treatment similar initially.  Avoid painful activities.  Start HEP and nitro patches (discussed risks of headaches, skin irritation).  NSAIDs, icing as needed.  Consider injection, increasing nitro dosage, PT if not improving.  F/u in 6 weeks.

## 2013-03-17 ENCOUNTER — Encounter: Payer: Self-pay | Admitting: Family Medicine

## 2013-03-17 ENCOUNTER — Ambulatory Visit (INDEPENDENT_AMBULATORY_CARE_PROVIDER_SITE_OTHER): Payer: BC Managed Care – PPO | Admitting: Family Medicine

## 2013-03-17 VITALS — BP 93/65 | HR 84 | Ht 62.0 in | Wt 140.0 lb

## 2013-03-17 DIAGNOSIS — M25519 Pain in unspecified shoulder: Secondary | ICD-10-CM

## 2013-03-17 DIAGNOSIS — M25511 Pain in right shoulder: Secondary | ICD-10-CM

## 2013-03-17 NOTE — Patient Instructions (Signed)
Wait about 5 days before restarting your exercises. Increase nitro patches to 1/2 a patch in a week if you're not improving after the injection. Follow up with me in 5-6 weeks for reevaluation.

## 2013-03-19 ENCOUNTER — Encounter: Payer: Self-pay | Admitting: Family Medicine

## 2013-03-19 NOTE — Progress Notes (Signed)
Patient ID: Heather Huynh, female   DOB: 1956-02-08, 58 y.o.   MRN: [id number]  PCP: No primary provider on file.  Subjective:   HPI: Patient is a 58 y.o. female here for right shoulder pain.  12/30: Patient denies known injury. States over past 2 months she has had slowly worsening right shoulder pain. Bothers her with abduction, lying on right side. Is left handed. Has not tried any medications, PT, icing/heat. Doesn't do much lifting, overhead motions at work.  2/2: Patient reports pain feels worse compared to last visit. She's using 1/4th patch of nitro. Doing home exercise program. Taking aleve. ROM feels limited. Trouble lying on right side. Problems with reaching overhead.  History reviewed. No pertinent past medical history.  Current Outpatient Prescriptions on File Prior to Visit  Medication Sig Dispense Refill  . diclofenac sodium (VOLTAREN) 1 % GEL aaa 4 grams qid.       . Ferrous Sulfate (IRON) 325 (65 FE) MG TABS Take 1 tablet by mouth daily.      . nitroGLYCERIN (NITRODUR - DOSED IN MG/24 HR) 0.2 mg/hr patch Apply 1/4 patch to affected area daily.  Change patch every 24 hours.  30 patch  1   No current facility-administered medications on file prior to visit.    History reviewed. No pertinent past surgical history.  Allergies  Allergen Reactions  . Ampicillin   . Erythromycin     History   Social History  . Marital Status: Divorced    Spouse Name: N/A    Number of Children: N/A  . Years of Education: N/A   Occupational History  . Not on file.   Social History Main Topics  . Smoking status: Never Smoker   . Smokeless tobacco: Never Used  . Alcohol Use: Not on file  . Drug Use: Not on file  . Sexual Activity: Not on file   Other Topics Concern  . Not on file   Social History Narrative  . No narrative on file    Family History  Problem Relation Age of Onset  . Sudden death Neg Hx   . Hypertension Neg Hx   . Hyperlipidemia Neg Hx    . Heart attack Neg Hx   . Diabetes Neg Hx     BP 93/65  Pulse 84  Ht 5\' 2"  (1.575 m)  Wt 140 lb (63.504 kg)  BMI 25.60 kg/m2  Review of Systems: See HPI above.    Objective:  Physical Exam:  Gen: NAD  Right shoulder: No swelling, ecchymoses.  No gross deformity. No TTP AC joint, biceps tendon. FROM with pain on ER and painful arc. Negative Hawkins, Neers. Negative Speeds, Yergasons. Strength 5/5 with empty can and resisted internal/external rotation.  Pain empty can. Pain with resisted ER. Negative apprehension. NV intact distally.    Assessment & Plan:  1. Right shoulder pain - most consistent with infraspinatus tendinitis, impingement.  May have degenerative labral tear.  Not improving with nitro patches.  Discussed options - she would like to try subacromial injection which was given today.  If not improving would increase nitro patches to 1/2 patch.  Consider PT if still not improving.  F/u in 5-6 weeks.  After informed written consent, patient was seated on exam table. Right shoulder was prepped with alcohol swab and utilizing posterior approach, patient's right subacromial space was injected with 3:1 marcaine: depomedrol. Patient tolerated the procedure well without immediate complications.

## 2013-03-19 NOTE — Assessment & Plan Note (Signed)
most consistent with infraspinatus tendinitis, impingement.  May have degenerative labral tear.  Not improving with nitro patches.  Discussed options - she would like to try subacromial injection which was given today.  If not improving would increase nitro patches to 1/2 patch.  Consider PT if still not improving.  F/u in 5-6 weeks.  After informed written consent, patient was seated on exam table. Right shoulder was prepped with alcohol swab and utilizing posterior approach, patient's right subacromial space was injected with 3:1 marcaine: depomedrol. Patient tolerated the procedure well without immediate complications.

## 2013-03-28 ENCOUNTER — Encounter: Payer: Self-pay | Admitting: Internal Medicine

## 2013-05-19 ENCOUNTER — Ambulatory Visit (AMBULATORY_SURGERY_CENTER): Payer: Self-pay | Admitting: *Deleted

## 2013-05-19 VITALS — Ht 62.5 in | Wt 143.0 lb

## 2013-05-19 DIAGNOSIS — Z1211 Encounter for screening for malignant neoplasm of colon: Secondary | ICD-10-CM

## 2013-05-19 MED ORDER — MOVIPREP 100 G PO SOLR: ORAL | Status: DC

## 2013-05-19 NOTE — Progress Notes (Signed)
No egg or soy allergy  No home oxygen use  Pt does have nausea or vomiting after all anesthesia

## 2013-05-22 ENCOUNTER — Encounter: Payer: Self-pay | Admitting: Internal Medicine

## 2013-06-02 ENCOUNTER — Encounter: Payer: Self-pay | Admitting: Internal Medicine

## 2013-06-02 ENCOUNTER — Ambulatory Visit (AMBULATORY_SURGERY_CENTER): Payer: BC Managed Care – PPO | Admitting: Internal Medicine

## 2013-06-02 VITALS — BP 96/60 | HR 52 | Temp 96.8°F | Resp 15 | Ht 62.5 in | Wt 143.0 lb

## 2013-06-02 DIAGNOSIS — Z1211 Encounter for screening for malignant neoplasm of colon: Secondary | ICD-10-CM

## 2013-06-02 MED ORDER — SODIUM CHLORIDE 0.9 % IV SOLN: 500.0000 mL | INTRAVENOUS | Status: DC

## 2013-06-02 NOTE — Progress Notes (Signed)
Report to pacu rn, vss, bbs=clear 

## 2013-06-02 NOTE — Patient Instructions (Signed)
YOU HAD AN ENDOSCOPIC PROCEDURE TODAY AT THE [facility]: Refer to the procedure report that was given to you for any specific questions about what was found during the examination.  If the procedure report does not answer your questions, please call your gastroenterologist to clarify.  If you requested that your care partner not be given the details of your procedure findings, then the procedure report has been included in a sealed envelope for you to review at your convenience later.  YOU SHOULD EXPECT: Some feelings of bloating in the abdomen. Passage of more gas than usual.  Walking can help get rid of the air that was put into your GI tract during the procedure and reduce the bloating. If you had a lower endoscopy (such as a colonoscopy or flexible sigmoidoscopy) you may notice spotting of blood in your stool or on the toilet paper. If you underwent a bowel prep for your procedure, then you may not have a normal bowel movement for a few days.  DIET: Your first meal following the procedure should be a light meal and then it is ok to progress to your normal diet.  A half-sandwich or bowl of soup is an example of a good first meal.  Heavy or fried foods are harder to digest and may make you feel nauseous or bloated.  Likewise meals heavy in dairy and vegetables can cause extra gas to form and this can also increase the bloating.  Drink plenty of fluids but you should avoid alcoholic beverages for 24 hours.  ACTIVITY: Your care partner should take you home directly after the procedure.  You should plan to take it easy, moving slowly for the rest of the day.  You can resume normal activity the day after the procedure however you should NOT DRIVE or use heavy machinery for 24 hours (because of the sedation medicines used during the test).    SYMPTOMS TO REPORT IMMEDIATELY: A gastroenterologist can be reached at any hour.  During normal business hours, 8:30 AM to 5:00 PM Monday through Friday,  call [phone].  After hours and on weekends, please call the GI answering service at [phone] who will take a message and have the physician on call contact you.   Following lower endoscopy (colonoscopy or flexible sigmoidoscopy):  Excessive amounts of blood in the stool  Significant tenderness or worsening of abdominal pains  Swelling of the abdomen that is new, acute  Fever of 100F or higher    FOLLOW UP: If any biopsies were taken you will be contacted by phone or by letter within the next 1-3 weeks.  Call your gastroenterologist if you have not heard about the biopsies in 3 weeks.  Our staff will call the home number listed on your records the next business day following your procedure to check on you and address any questions or concerns that you may have at that time regarding the information given to you following your procedure. This is a courtesy call and so if there is no answer at the home number and we have not heard from you through the emergency physician on call, we will assume that you have returned to your regular daily activities without incident.  SIGNATURES/CONFIDENTIALITY: You and/or your care partner have signed paperwork which will be entered into your electronic medical record.  These signatures attest to the fact that that the information above on your After Visit Summary has been reviewed and is understood.  Full responsibility of the confidentiality   of this discharge information lies with you and/or your care-partner.  Normal colonoscopy-Repeat in 10 years-2025. 

## 2013-06-02 NOTE — Op Note (Signed)
[place]  [address]. [place], [address]   COLONOSCOPY PROCEDURE REPORT  PATIENT: Heather Huynh, Heather Huynh  MR#: [id number] BIRTHDATE: Jun 15, 1955 , 32  yrs. old GENDER: Female ENDOSCOPIST: Jerene Bears, MD PROCEDURE DATE:  06/02/2013 PROCEDURE:   Colonoscopy, screening First Screening Colonoscopy - Avg.  risk and is 50 yrs.  old or older Yes.  Prior Negative Screening - Now for repeat screening. N/A  History of Adenoma - Now for follow-up colonoscopy & has been > or = to 3 yrs.  N/A  Polyps Removed Today? No.  Recommend repeat exam, <10 yrs? No. ASA CLASS:   Class II INDICATIONS:average risk screening and first colonoscopy. MEDICATIONS: MAC sedation, administered by CRNA and Propofol (Diprivan) 260 mg IV  DESCRIPTION OF PROCEDURE:   After the risks benefits and alternatives of the procedure were thoroughly explained, informed consent was obtained.  A digital rectal exam revealed no rectal mass.   The LB [id number]  endoscope was introduced through the anus and advanced to the cecum, which was identified by both the appendix and ileocecal valve. No adverse events experienced. The quality of the prep was good, using MoviPrep  The instrument was then slowly withdrawn as the colon was fully examined.    COLON FINDINGS: Mild melanosis was found throughout the entire examined colon, left greater than right colon.   The colon was otherwise normal.  There was no diverticulosis, inflammation, polyps or cancers seen.  Retroflexed views revealed no abnormalities. The time to cecum=5 minutes 07 seconds.  Withdrawal time=10 minutes 34 seconds.  The scope was withdrawn and the procedure completed. COMPLICATIONS: There were no complications.  ENDOSCOPIC IMPRESSION: 1.   Mild melanosis was found throughout the entire examined colon 2.   The colon was otherwise normal  RECOMMENDATIONS: You should continue to follow colorectal cancer screening guidelines for  "routine risk" patients with a repeat colonoscopy in 10 years. There is no need for FOBT (stool) testing for at least 5 years.   eSigned:  Jerene Bears, MD 06/02/2013 11:34 AM   cc: The Patient; Alyson Ingles, MD

## 2013-06-03 ENCOUNTER — Telehealth: Payer: Self-pay | Admitting: *Deleted

## 2013-06-03 NOTE — Telephone Encounter (Signed)
  Follow up Call-  Call back number 06/02/2013  Post procedure Call Back phone  # [phone]  Permission to leave phone message Yes     Patient questions:  Do you have a fever, pain , or abdominal swelling? no Pain Score  0 *  Have you tolerated food without any problems? yes  Have you been able to return to your normal activities? yes  Do you have any questions about your discharge instructions: Diet   no Medications  no Follow up visit  no  Do you have questions or concerns about your Care? no  Actions: * If pain score is 4 or above: No action needed, pain <4.

## 2013-08-14 ENCOUNTER — Ambulatory Visit (INDEPENDENT_AMBULATORY_CARE_PROVIDER_SITE_OTHER): Payer: BC Managed Care – PPO | Admitting: Family Medicine

## 2013-08-14 ENCOUNTER — Encounter: Payer: Self-pay | Admitting: Family Medicine

## 2013-08-14 VITALS — BP 105/62 | HR 69 | Ht 62.0 in | Wt 139.0 lb

## 2013-08-14 DIAGNOSIS — M25519 Pain in unspecified shoulder: Secondary | ICD-10-CM

## 2013-08-14 DIAGNOSIS — M25511 Pain in right shoulder: Secondary | ICD-10-CM

## 2013-08-18 ENCOUNTER — Encounter: Payer: Self-pay | Admitting: Family Medicine

## 2013-08-18 NOTE — Assessment & Plan Note (Signed)
most consistent with infraspinatus tendinitis, impingement.  May have degenerative labral tear.  Not improving with nitro patches, HEP, subacromial injection, nsaids.  Will go ahead with MR arthrogram.  Likely orthopedic surgeon referral following this.

## 2013-08-18 NOTE — Progress Notes (Addendum)
Patient ID: Heather Huynh, female   DOB: [birth year], 58 y.o.   MRN: [id number]  PCP: No primary provider on file.  Subjective:   HPI: Patient is a 58 y.o. female here for right shoulder pain.  12/30: Patient denies known injury. States over past 2 months she has had slowly worsening right shoulder pain. Bothers her with abduction, lying on right side. Is left handed. Has not tried any medications, PT, icing/heat. Doesn't do much lifting, overhead motions at work.  2/2: Patient reports pain feels worse compared to last visit. She's using 1/4th patch of nitro. Doing home exercise program. Taking aleve. ROM feels limited. Trouble lying on right side. Problems with reaching overhead.  7/2: Patient continues to struggle with right shoulder pain. Injection helped for 2 months. Still very painful, motion limited. Doing HEP. Used nitro patches for a couple months also. No new injuries. + night pain when lying on right side.  Past Medical History  Diagnosis Date  . Anemia   . GERD (gastroesophageal reflux disease)     Current Outpatient Prescriptions on File Prior to Visit  Medication Sig Dispense Refill  . diclofenac (VOLTAREN) 75 MG EC tablet       . Ferrous Sulfate (IRON) 325 (65 FE) MG TABS Take 1 tablet by mouth daily.      Marland Kitchen KRILL OIL PO Take by mouth daily.      Marland Kitchen omeprazole (PRILOSEC) 20 MG capsule 2 (two) times daily before a meal.        No current facility-administered medications on file prior to visit.    Past Surgical History  Procedure Laterality Date  . Cesarean section    . Tendon repair    . Acl      Allergies  Allergen Reactions  . Ampicillin     Swelling, red patches  . Erythromycin     Swelling, red patches    History   Social History  . Marital Status: Divorced    Spouse Name: N/A    Number of Children: N/A  . Years of Education: N/A   Occupational History  . Not on file.   Social History Main Topics  . Smoking status: Never  Smoker   . Smokeless tobacco: Never Used  . Alcohol Use: Yes     Comment: rarely  . Drug Use: No  . Sexual Activity: Not on file   Other Topics Concern  . Not on file   Social History Narrative  . No narrative on file    Family History  Problem Relation Age of Onset  . Sudden death Neg Hx   . Hypertension Neg Hx   . Hyperlipidemia Neg Hx   . Heart attack Neg Hx   . Diabetes Neg Hx   . Colon cancer Neg Hx   . Esophageal cancer Neg Hx   . Stomach cancer Neg Hx   . Rectal cancer Neg Hx   . Breast cancer Mother   . Breast cancer Maternal Grandmother   . Heart disease Maternal Grandmother   . Heart disease Maternal Grandfather     BP 105/62  Pulse 69  Ht 5\' 2"  (1.575 m)  Wt 139 lb (63.05 kg)  BMI 25.42 kg/m2  Review of Systems: See HPI above.    Objective:  Physical Exam:  Gen: NAD  Right shoulder: No swelling, ecchymoses.  No gross deformity. No TTP AC joint, biceps tendon. FROM with pain on ER and painful arc. Negative Hawkins, Neers. Negative Speeds, Yergasons. Strength 5/5  with empty can and resisted internal/external rotation.  Pain empty can. Pain with resisted ER. Negative apprehension. Positive o'briens. NV intact distally.    Assessment & Plan:  1. Right shoulder pain - most consistent with infraspinatus tendinitis, impingement.  May have degenerative labral tear.  Not improving with nitro patches, HEP, subacromial injection, nsaids.  Will go ahead with MR arthrogram.  Likely orthopedic surgeon referral following this.  Addendum:  MRI reviewed and discussed with patient.  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  Will refer to orthopedics for surgical intervention - discussed with this amount of retraction may be difficult to repair - no mention of fatty infiltration of musculature which is a positive sign.

## 2013-09-01 ENCOUNTER — Ambulatory Visit
Admission: RE | Admit: 2013-09-01 | Discharge: 2013-09-01 | Disposition: A | Discharge status: Home / Self Care | Payer: BC Managed Care – PPO | Source: Ambulatory Visit | Attending: Family Medicine | Admitting: Family Medicine

## 2013-09-01 DIAGNOSIS — M25511 Pain in right shoulder: Secondary | ICD-10-CM

## 2013-09-01 MED ORDER — IOHEXOL 180 MG/ML  SOLN
13.0000 mL | Freq: Once | INTRAMUSCULAR | Status: AC | PRN
  Administered 2013-09-01: 13 mL via INTRA_ARTICULAR

## 2013-09-05 ENCOUNTER — Other Ambulatory Visit: Payer: Self-pay | Admitting: *Deleted

## 2013-09-05 DIAGNOSIS — M75101 Unspecified rotator cuff tear or rupture of right shoulder, not specified as traumatic: Secondary | ICD-10-CM

## 2013-09-08 ENCOUNTER — Other Ambulatory Visit: Payer: BC Managed Care – PPO

## 2014-03-23 ENCOUNTER — Other Ambulatory Visit (HOSPITAL_COMMUNITY): Payer: Self-pay | Admitting: Cardiology

## 2014-03-23 DIAGNOSIS — M79606 Pain in leg, unspecified: Secondary | ICD-10-CM

## 2014-03-24 ENCOUNTER — Ambulatory Visit (HOSPITAL_COMMUNITY): Payer: BLUE CROSS/BLUE SHIELD | Attending: Orthopedic Surgery | Admitting: Cardiology

## 2014-03-24 DIAGNOSIS — M79606 Pain in leg, unspecified: Secondary | ICD-10-CM | POA: Diagnosis not present

## 2014-03-24 DIAGNOSIS — M7989 Other specified soft tissue disorders: Secondary | ICD-10-CM | POA: Insufficient documentation

## 2014-03-24 NOTE — Progress Notes (Signed)
Left lower venous duplex performed  

## 2014-07-02 ENCOUNTER — Other Ambulatory Visit: Payer: Self-pay

## 2014-07-02 DIAGNOSIS — Z1231 Encounter for screening mammogram for malignant neoplasm of breast: Secondary | ICD-10-CM

## 2014-07-22 ENCOUNTER — Ambulatory Visit
Admission: RE | Admit: 2014-07-22 | Discharge: 2014-07-22 | Disposition: A | Discharge status: Home / Self Care | Payer: BLUE CROSS/BLUE SHIELD | Source: Ambulatory Visit

## 2014-07-22 DIAGNOSIS — Z1231 Encounter for screening mammogram for malignant neoplasm of breast: Secondary | ICD-10-CM

## 2015-01-04 ENCOUNTER — Ambulatory Visit: Payer: BLUE CROSS/BLUE SHIELD | Admitting: Internal Medicine

## 2015-01-19 ENCOUNTER — Encounter: Payer: Self-pay | Admitting: Internal Medicine

## 2015-01-19 ENCOUNTER — Ambulatory Visit (INDEPENDENT_AMBULATORY_CARE_PROVIDER_SITE_OTHER): Payer: BLUE CROSS/BLUE SHIELD | Admitting: Internal Medicine

## 2015-01-19 VITALS — BP 90/60 | HR 74 | Temp 97.5°F | Resp 16 | Wt 140.0 lb

## 2015-01-19 DIAGNOSIS — G5700 Lesion of sciatic nerve, unspecified lower limb: Secondary | ICD-10-CM | POA: Insufficient documentation

## 2015-01-19 DIAGNOSIS — E162 Hypoglycemia, unspecified: Secondary | ICD-10-CM | POA: Diagnosis not present

## 2015-01-19 DIAGNOSIS — K219 Gastro-esophageal reflux disease without esophagitis: Secondary | ICD-10-CM

## 2015-01-19 DIAGNOSIS — Z23 Encounter for immunization: Secondary | ICD-10-CM | POA: Diagnosis not present

## 2015-01-19 DIAGNOSIS — Z0000 Encounter for general adult medical examination without abnormal findings: Secondary | ICD-10-CM

## 2015-01-19 LAB — GLUCOSE, POCT (MANUAL RESULT ENTRY): POC Glucose: 104 mg/dl — AB (ref 70–99)

## 2015-01-19 MED ORDER — EMPAGLIFLOZIN 10 MG PO TABS: 10.0000 mg | ORAL_TABLET | Freq: Every day | ORAL | Status: DC

## 2015-01-19 MED ORDER — BLOOD GLUCOSE MONITOR KIT: PACK | Status: DC

## 2015-01-19 NOTE — Progress Notes (Signed)
Pre visit review using our clinic review tool, if applicable. No additional management support is needed unless otherwise documented below in the visit note. 

## 2015-01-19 NOTE — Progress Notes (Signed)
Subjective:    Patient ID: Heather Huynh, female    DOB: 1955/11/03, 59 y.o.   MRN: [id number]  HPI She is here to establish with a new pcp.  She is here for a physical exam.    In the last year she went to the ED twice for back pain/UTI.  Given flexeril for pain and an antibiotic.  No kidney stones found.  Her symptoms started directly with kidney pain.  She did not have dysuria or increased frequency.  She has only had these two UTI's.  She went through menopause about 4 years ago.  She does not see a gyn.     Cough, dry/GERD: She saw an allergist.  Put on omeprazole.  Cough improved.  Her throat intermittently feels sore.  She still has some GERD on occasion.   Hypoglycemia:  She gets slurred speech and losses her vision when she has hypoglycemia.  This runs in her family and they all have different symptoms.  she was diagnosed with this when she was a teenager.  She eats a low carb diet.  She always has protein in her meals.  The hypoglycemia has no pattern/obvious cause. She has been overeating and has gained weight.  She feels her sugars are cycling.  She is frustrated by her symptoms - they can occur quickly and make her feel horrible.  She needs to find a better solution.  She has done research and there was a small study done by [employer] using jardiance to help control hypoglycemia - it works by taking some of the sugar out of the urine.  She wants to try it and is willing to pay for it.    Medications and allergies reviewed with patient and updated if appropriate.  Patient Active Problem List   Diagnosis Date Noted  . Right shoulder pain 02/13/2013  . Left buttock pain 10/09/2011  . METATARSALGIA 02/25/2009  . 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 ABNORMALITY OF GAIT 02/25/2009  . ACHILLES BURSITIS OR TENDINITIS 03/05/2008    Current Outpatient Prescriptions on File Prior to Visit  Medication Sig Dispense Refill  . diclofenac (VOLTAREN) 75 MG EC tablet     .  Ferrous Sulfate (IRON) 325 (65 FE) MG TABS Take 1 tablet by mouth daily.    Marland Kitchen omeprazole (PRILOSEC) 20 MG capsule 2 (two) times daily before a meal.      No current facility-administered medications on file prior to visit.    Past Medical History  Diagnosis Date  . Anemia   . GERD (gastroesophageal reflux disease)     Past Surgical History  Procedure Laterality Date  . Cesarean section    . Tendon repair    . Acl      Social History   Social History  . Marital Status: Divorced    Spouse Name: N/A  . Number of Children: N/A  . Years of Education: N/A   Social History Main Topics  . Smoking status: Never Smoker   . Smokeless tobacco: Never Used  . Alcohol Use: Yes     Comment: rarely  . Drug Use: No  . Sexual Activity: Not Asked   Other Topics Concern  . None   Social History Narrative    Review of Systems  Constitutional: Negative for fever and chills.  HENT: Negative for hearing loss.   Eyes: Negative for visual disturbance.  Respiratory: Positive for cough. Negative for shortness of breath and wheezing.   Cardiovascular: Negative  for chest pain, palpitations and leg swelling.  Gastrointestinal: Negative for nausea, abdominal pain, diarrhea, constipation and blood in stool.  Genitourinary: Negative for dysuria and hematuria.  Musculoskeletal: Negative for myalgias, back pain and arthralgias.  Neurological: Negative for dizziness, weakness, light-headedness, numbness and headaches.  Psychiatric/Behavioral: Negative for dysphoric mood. The patient is not nervous/anxious.        Objective:   Filed Vitals:   01/19/15 1459  BP: 90/60  Pulse: 74  Temp: 97.5 F (36.4 C)  Resp: 16   Filed Weights   01/19/15 1459  Weight: 140 lb (63.504 kg)   Body mass index is 25.6 kg/(m^2).   Physical Exam Constitutional: She appears well-developed and well-nourished. No distress.  HENT:  Head: Normocephalic and atraumatic.  Right Ear: External ear normal.  Left  Ear: External ear normal.  Mouth/Throat: Oropharynx is clear and moist.  Normal bilateral ear canals and tympanic membranes  Eyes: Conjunctivae and EOM are normal.  Neck: Neck supple. No tracheal deviation present. No thyromegaly present.  No carotid bruit  Cardiovascular: Normal rate, regular rhythm and normal heart sounds.   No murmur heard. Pulmonary/Chest: Effort normal and breath sounds normal. No respiratory distress. She has no wheezes. She has no rales.  Abdominal: Soft. She exhibits no distension. There is no tenderness.  Musculoskeletal: She exhibits no edema.  Lymphadenopathy:    She has no cervical adenopathy.  Skin: Skin is warm and dry. She is not diaphoretic.  Psychiatric: She has a normal mood and affect. Her behavior is normal.       Assessment & Plan:   Physical exam: Screening blood work is done at work and has always been normal EKG today is normal tdap today Flu up to date Advised to schedule a gyn appt Mammogram up to date Colonoscopy up to date Deferred hiv/hepatitis C testing Continue regular exercise  GERD: Reviewed GERD diet Continue medication - goal is to taper off eventually  Pyriformis syndrome, left Has seen ortho Stretches diclofenac as needed  Hypoglycemia Problem for years - associated with loss of vision and slurred speech She is very symptomatic and has not been able to control with eating Eats high protein diet, low carb/sugar diet Exercises regularly Start using a glucometer to track sugars Will do a trial of jardiance for one month - this is an off label use and discussed that we need to be very careful.   Eat regularly, monitor sugars frequently Check cmp, a1c at next visit Consider endocrine referral for their opinion Follow up in 4 weeks, sooner if needed

## 2015-01-19 NOTE — Patient Instructions (Addendum)
All other Health Maintenance issues reviewed.   All recommended immunizations and age-appropriate screenings are up-to-date.  Tetanus vaccine administered today.   Medications reviewed and updated.  We will try a medication for your hypoglycemia.   Your prescription(s) have been submitted to your pharmacy. Please take as directed and contact our office if you believe you are having problem(s) with the medication(s).   Please schedule followup in 4 weeks  Health Maintenance, Female Adopting a healthy lifestyle and getting preventive care can go a long way to promote health and wellness. Talk with your health care provider about what schedule of regular examinations is right for you. This is a good chance for you to check in with your provider about disease prevention and staying healthy. In between checkups, there are plenty of things you can do on your own. Experts have done a lot of research about which lifestyle changes and preventive measures are most likely to keep you healthy. Ask your health care provider for more information. WEIGHT AND DIET  Eat a healthy diet  Be sure to include plenty of vegetables, fruits, low-fat dairy products, and lean protein.  Do not eat a lot of foods high in solid fats, added sugars, or salt.  Get regular exercise. This is one of the most important things you can do for your health.  Most adults should exercise for at least 150 minutes each week. The exercise should increase your heart rate and make you sweat (moderate-intensity exercise).  Most adults should also do strengthening exercises at least twice a week. This is in addition to the moderate-intensity exercise.  Maintain a healthy weight  Body mass index (BMI) is a measurement that can be used to identify possible weight problems. It estimates body fat based on height and weight. Your health care provider can help determine your BMI and help you achieve or maintain a healthy weight.  For  females 71 years of age and older:   A BMI below 18.5 is considered underweight.  A BMI of 18.5 to 24.9 is normal.  A BMI of 25 to 29.9 is considered overweight.  A BMI of 30 and above is considered obese.  Watch levels of cholesterol and blood lipids  You should start having your blood tested for lipids and cholesterol at 59 years of age, then have this test every 5 years.  You may need to have your cholesterol levels checked more often if:  Your lipid or cholesterol levels are high.  You are older than 59 years of age.  You are at high risk for heart disease.  CANCER SCREENING   Lung Cancer  Lung cancer screening is recommended for adults 41-58 years old who are at high risk for lung cancer because of a history of smoking.  A yearly low-dose CT scan of the lungs is recommended for people who:  Currently smoke.  Have quit within the past 15 years.  Have at least a 30-pack-year history of smoking. A pack year is smoking an average of one pack of cigarettes a day for 1 year.  Yearly screening should continue until it has been 15 years since you quit.  Yearly screening should stop if you develop a health problem that would prevent you from having lung cancer treatment.  Breast Cancer  Practice breast self-awareness. This means understanding how your breasts normally appear and feel.  It also means doing regular breast self-exams. Let your health care provider know about any changes, no matter how small.  If you are in your 20s or 30s, you should have a clinical breast exam (CBE) by a health care provider every 1-3 years as part of a regular health exam.  If you are 18 or older, have a CBE every year. Also consider having a breast X-ray (mammogram) every year.  If you have a family history of breast cancer, talk to your health care provider about genetic screening.  If you are at high risk for breast cancer, talk to your health care provider about having an MRI and  a mammogram every year.  Breast cancer gene (BRCA) assessment is recommended for women who have family members with BRCA-related cancers. BRCA-related cancers include:  Breast.  Ovarian.  Tubal.  Peritoneal cancers.  Results of the assessment will determine the need for genetic counseling and BRCA1 and BRCA2 testing. Cervical Cancer Your health care provider may recommend that you be screened regularly for cancer of the pelvic organs (ovaries, uterus, and vagina). This screening involves a pelvic examination, including checking for microscopic changes to the surface of your cervix (Pap test). You may be encouraged to have this screening done every 3 years, beginning at age 59.  For women ages 20-65, health care providers may recommend pelvic exams and Pap testing every 3 years, or they may recommend the Pap and pelvic exam, combined with testing for human papilloma virus (HPV), every 5 years. Some types of HPV increase your risk of cervical cancer. Testing for HPV may also be done on women of any age with unclear Pap test results.  Other health care providers may not recommend any screening for nonpregnant women who are considered low risk for pelvic cancer and who do not have symptoms. Ask your health care provider if a screening pelvic exam is right for you.  If you have had past treatment for cervical cancer or a condition that could lead to cancer, you need Pap tests and screening for cancer for at least 20 years after your treatment. If Pap tests have been discontinued, your risk factors (such as having a new sexual partner) need to be reassessed to determine if screening should resume. Some women have medical problems that increase the chance of getting cervical cancer. In these cases, your health care provider may recommend more frequent screening and Pap tests. Colorectal Cancer  This type of cancer can be detected and often prevented.  Routine colorectal cancer screening usually  begins at 59 years of age and continues through 59 years of age.  Your health care provider may recommend screening at an earlier age if you have risk factors for colon cancer.  Your health care provider may also recommend using home test kits to check for hidden blood in the stool.  A small camera at the end of a tube can be used to examine your colon directly (sigmoidoscopy or colonoscopy). This is done to check for the earliest forms of colorectal cancer.  Routine screening usually begins at age 57.  Direct examination of the colon should be repeated every 5-10 years through 59 years of age. However, you may need to be screened more often if early forms of precancerous polyps or small growths are found. Skin Cancer  Check your skin from head to toe regularly.  Tell your health care provider about any new moles or changes in moles, especially if there is a change in a mole's shape or color.  Also tell your health care provider if you have a mole that is larger than the  size of a pencil eraser.  Always use sunscreen. Apply sunscreen liberally and repeatedly throughout the day.  Protect yourself by wearing long sleeves, pants, a wide-brimmed hat, and sunglasses whenever you are outside. HEART DISEASE, DIABETES, AND HIGH BLOOD PRESSURE   High blood pressure causes heart disease and increases the risk of stroke. High blood pressure is more likely to develop in:  People who have blood pressure in the high end of the normal range (130-139/85-89 mm Hg).  People who are overweight or obese.  People who are African American.  If you are 58-73 years of age, have your blood pressure checked every 3-5 years. If you are 72 years of age or older, have your blood pressure checked every year. You should have your blood pressure measured twice--once when you are at a hospital or clinic, and once when you are not at a hospital or clinic. Record the average of the two measurements. To check your blood  pressure when you are not at a hospital or clinic, you can use:  An automated blood pressure machine at a pharmacy.  A home blood pressure monitor.  If you are between 66 years and 50 years old, ask your health care provider if you should take aspirin to prevent strokes.  Have regular diabetes screenings. This involves taking a blood sample to check your fasting blood sugar level.  If you are at a normal weight and have a low risk for diabetes, have this test once every three years after 59 years of age.  If you are overweight and have a high risk for diabetes, consider being tested at a younger age or more often. PREVENTING INFECTION  Hepatitis B  If you have a higher risk for hepatitis B, you should be screened for this virus. You are considered at high risk for hepatitis B if:  You were born in a country where hepatitis B is common. Ask your health care provider which countries are considered high risk.  Your parents were born in a high-risk country, and you have not been immunized against hepatitis B (hepatitis B vaccine).  You have HIV or AIDS.  You use needles to inject street drugs.  You live with someone who has hepatitis B.  You have had sex with someone who has hepatitis B.  You get hemodialysis treatment.  You take certain medicines for conditions, including cancer, organ transplantation, and autoimmune conditions. Hepatitis C  Blood testing is recommended for:  Everyone born from [birth year] through 1965.  Anyone with known risk factors for hepatitis C. Sexually transmitted infections (STIs)  You should be screened for sexually transmitted infections (STIs) including gonorrhea and chlamydia if:  You are sexually active and are younger than 59 years of age.  You are older than 59 years of age and your health care provider tells you that you are at risk for this type of infection.  Your sexual activity has changed since you were last screened and you are at an  increased risk for chlamydia or gonorrhea. Ask your health care provider if you are at risk.  If you do not have HIV, but are at risk, it may be recommended that you take a prescription medicine daily to prevent HIV infection. This is called pre-exposure prophylaxis (PrEP). You are considered at risk if:  You are sexually active and do not regularly use condoms or know the HIV status of your partner(s).  You take drugs by injection.  You are sexually active with a partner who  has HIV. Talk with your health care provider about whether you are at high risk of being infected with HIV. If you choose to begin PrEP, you should first be tested for HIV. You should then be tested every 3 months for as long as you are taking PrEP.  PREGNANCY   If you are premenopausal and you may become pregnant, ask your health care provider about preconception counseling.  If you may become pregnant, take 400 to 800 micrograms (mcg) of folic acid every day.  If you want to prevent pregnancy, talk to your health care provider about birth control (contraception). OSTEOPOROSIS AND MENOPAUSE   Osteoporosis is a disease in which the bones lose minerals and strength with aging. This can result in serious bone fractures. Your risk for osteoporosis can be identified using a bone density scan.  If you are 82 years of age or older, or if you are at risk for osteoporosis and fractures, ask your health care provider if you should be screened.  Ask your health care provider whether you should take a calcium or vitamin D supplement to lower your risk for osteoporosis.  Menopause may have certain physical symptoms and risks.  Hormone replacement therapy may reduce some of these symptoms and risks. Talk to your health care provider about whether hormone replacement therapy is right for you.  HOME CARE INSTRUCTIONS   Schedule regular health, dental, and eye exams.  Stay current with your immunizations.   Do not use any  tobacco products including cigarettes, chewing tobacco, or electronic cigarettes.  If you are pregnant, do not drink alcohol.  If you are breastfeeding, limit how much and how often you drink alcohol.  Limit alcohol intake to no more than 1 drink per day for nonpregnant women. One drink equals 12 ounces of beer, 5 ounces of wine, or 1 ounces of hard liquor.  Do not use street drugs.  Do not share needles.  Ask your health care provider for help if you need support or information about quitting drugs.  Tell your health care provider if you often feel depressed.  Tell your health care provider if you have ever been abused or do not feel safe at home.   This information is not intended to replace advice given to you by your health care provider. Make sure you discuss any questions you have with your health care provider.   Document Released: 08/15/2010 Document Revised: 02/20/2014 Document Reviewed: 01/01/2013 Elsevier Interactive Patient Education [employer].

## 2015-01-26 ENCOUNTER — Telehealth: Payer: Self-pay | Admitting: Emergency Medicine

## 2015-01-26 NOTE — Telephone Encounter (Signed)
Spoke with [facility] to clarify quantity of Diabetic supplies of #100 with 3 refills.

## 2015-02-19 ENCOUNTER — Ambulatory Visit: Payer: BLUE CROSS/BLUE SHIELD | Admitting: Internal Medicine

## 2015-06-08 ENCOUNTER — Telehealth: Payer: Self-pay | Admitting: *Deleted

## 2015-06-08 MED ORDER — OMEPRAZOLE 20 MG PO CPDR: 20.0000 mg | DELAYED_RELEASE_CAPSULE | Freq: Two times a day (BID) | ORAL | Status: DC

## 2015-06-08 NOTE — Telephone Encounter (Signed)
Left msg on triage needing refill on her omeprazole sent t [facility]. Rx sent electronically...Heather Huynh

## 2015-06-09 ENCOUNTER — Ambulatory Visit (INDEPENDENT_AMBULATORY_CARE_PROVIDER_SITE_OTHER): Payer: BLUE CROSS/BLUE SHIELD | Admitting: Sports Medicine

## 2015-06-09 ENCOUNTER — Encounter: Payer: Self-pay | Admitting: Sports Medicine

## 2015-06-09 VITALS — BP 100/60 | Ht 62.0 in | Wt 140.0 lb

## 2015-06-09 DIAGNOSIS — M545 Low back pain, unspecified: Secondary | ICD-10-CM

## 2015-06-09 MED ORDER — METHYLPREDNISOLONE ACETATE 40 MG/ML IJ SUSP
40.0000 mg | Freq: Once | INTRAMUSCULAR | Status: AC
  Administered 2015-06-09: 40 mg via INTRA_ARTICULAR

## 2015-06-09 NOTE — Progress Notes (Signed)
Heather Huynh - 60 y.o. female MRN [id number]  Date of birth: 08/29/1955  CC: Chronic low back pain  SUBJECTIVE:   HPI  Mrs. Heather Huynh is a very pleasant 60 yo female with 3+ years of persistent low back pain.  She has the most pain/stiffness every morning.  She denies any radicular symptoms including lower extremity weakness. She has had chiropractic adjustments that temporarily resolve the pain.  No pain medications on a regular basis.  Previously had piriformis injection, but this does not seem similar to that issue. Very localized to the distal right lower back over the last few years. No pain with walking or sitting. She is very active and lifts weights regularly. She does limited stretching and knows she is not very flexible. No medications. No red flags.   ROS:     As above, no fevers, chills, or night sweats. NO weakness, imbalance. No loss of bowel or bladder dysfunction.   HISTORY: Past Medical, Surgical, Social, and Family History Reviewed & Updated per EMR.  Pertinent Historical Findings include: GERD, history of piriformis syndrome on the left  Data review: 01/2012 lumbar MRI with L3-L4 chronic DDD with discogenic edema on the right.  Small left extraforaminal disc bulge with mild facet degeneration on the left.   OBJECTIVE: BP 100/60 mmHg  Ht _0  (1.575 m)  Wt 140 lb (63.504 kg)  BMI 25.60 kg/m2  Physical Exam\ Calm, nad Non-labored breathing  Back Exam: Inspection: wnl Motion: wnl, with no pain whatsoever SLR seated:  negative     SLR lying: negative                       XSLR seated:  negative    XSLR lying: negative                       Seated HS Flexibility: poor, significantly restricted Palpable tenderness:Superior aspect of right SI joint.  FABER:  No SI pain, although poor flexibility Sensory change: none Reflex change: none  Strength at foot Plantar-flexion: 5 / 5    Dorsi-flexion:  / 5    Eversion: 5 / 5   Inversion:  / 5 Leg strength Quad:  / 5    Hamstring: 5 / 5   Hip flexor:  / 5   Hip abductors:  / 5 Gait Walking: non-antalgic          Consent obtained and verified. Sterile betadine prep. Furthur cleansed with alcohol. Topical analgesic spray: Ethyl chloride. Joint: right superior superficial SI joint Approached in typical fashion with: palpation based Completed without difficulty Meds: 1:1 with 41m of methylpred and 1% xylocaine Needle: 22g x 1.5" Aftercare instructions and Red flags advised.  MEDICATIONS, LABS & OTHER ORDERS: Previous Medications   BLOOD GLUCOSE METER KIT AND SUPPLIES KIT    Dispense based on patient and insurance preference. Use up to four times daily as directed. For hypoglycemia   DICLOFENAC (VOLTAREN) 75 MG EC TABLET       EMPAGLIFLOZIN (JARDIANCE) 10 MG TABS TABLET    Take 10 mg by mouth daily.   FERROUS SULFATE (IRON) 325 (65 FE) MG TABS    Take 1 tablet by mouth daily.   OMEPRAZOLE (PRILOSEC) 20 MG CAPSULE    Take 1 capsule (20 mg total) by mouth 2 (two) times daily before a meal.   Modified Medications   No medications on file   New Prescriptions   No medications on file  Discontinued Medications   No medications on file   Orders Placed This Encounter  Procedures  . DG Pelvis 1-2 Views  . DG Lumbar Spine 2-3 Views   ASSESSMENT & PLAN: Right SI joint dysfunction: No primary back pathology. No red flags or radicular symptoms. Superficial SI joint injection today. We will get an x-ray of her lumbar spine and pelvis to rule out any newly developed bony pathology. She may continue with chiropractic adjustments. PT may be of benefit in the future. Yoga may also be of benefit to her.  We will see her back in 6 weeks if needed. She is to follow up sooner if she develops any new symptoms.

## 2015-06-14 ENCOUNTER — Other Ambulatory Visit: Payer: Self-pay | Admitting: *Deleted

## 2015-06-14 MED ORDER — OMEPRAZOLE 20 MG PO CPDR: 20.0000 mg | DELAYED_RELEASE_CAPSULE | Freq: Two times a day (BID) | ORAL | Status: DC

## 2015-06-14 NOTE — Telephone Encounter (Signed)
Received call [facility] Verified which medication she is needing sent omeprazole electronically to mail service...Johny Chess

## 2015-07-15 DIAGNOSIS — M5136 Other intervertebral disc degeneration, lumbar region: Secondary | ICD-10-CM | POA: Diagnosis not present

## 2015-07-15 DIAGNOSIS — M9903 Segmental and somatic dysfunction of lumbar region: Secondary | ICD-10-CM | POA: Diagnosis not present

## 2015-08-06 DIAGNOSIS — H1013 Acute atopic conjunctivitis, bilateral: Secondary | ICD-10-CM | POA: Diagnosis not present

## 2015-08-11 DIAGNOSIS — H04203 Unspecified epiphora, bilateral lacrimal glands: Secondary | ICD-10-CM | POA: Diagnosis not present

## 2015-08-24 DIAGNOSIS — H1013 Acute atopic conjunctivitis, bilateral: Secondary | ICD-10-CM | POA: Diagnosis not present

## 2015-09-30 ENCOUNTER — Encounter: Payer: Self-pay | Admitting: Nurse Practitioner

## 2015-09-30 ENCOUNTER — Ambulatory Visit (INDEPENDENT_AMBULATORY_CARE_PROVIDER_SITE_OTHER): Payer: BLUE CROSS/BLUE SHIELD | Admitting: Nurse Practitioner

## 2015-09-30 ENCOUNTER — Telehealth: Payer: Self-pay | Admitting: Internal Medicine

## 2015-09-30 VITALS — BP 110/62 | HR 76 | Temp 98.2°F

## 2015-09-30 DIAGNOSIS — J069 Acute upper respiratory infection, unspecified: Secondary | ICD-10-CM

## 2015-09-30 DIAGNOSIS — M9903 Segmental and somatic dysfunction of lumbar region: Secondary | ICD-10-CM | POA: Diagnosis not present

## 2015-09-30 DIAGNOSIS — M5136 Other intervertebral disc degeneration, lumbar region: Secondary | ICD-10-CM | POA: Diagnosis not present

## 2015-09-30 MED ORDER — DM-GUAIFENESIN ER 30-600 MG PO TB12: 1.0000 | ORAL_TABLET | Freq: Two times a day (BID) | ORAL | 0 refills | Status: DC | PRN

## 2015-09-30 MED ORDER — DOXYCYCLINE HYCLATE 100 MG PO TABS: 100.0000 mg | ORAL_TABLET | Freq: Two times a day (BID) | ORAL | 0 refills | Status: DC

## 2015-09-30 MED ORDER — METHYLPREDNISOLONE ACETATE 80 MG/ML IJ SUSP: 80.0000 mg | Freq: Once | INTRAMUSCULAR | Status: DC

## 2015-09-30 MED ORDER — METHYLPREDNISOLONE ACETATE 80 MG/ML IJ SUSP
80.0000 mg | Freq: Once | INTRAMUSCULAR | Status: AC
  Administered 2015-09-30: 80 mg via INTRAMUSCULAR

## 2015-09-30 MED ORDER — FLUTICASONE PROPIONATE 50 MCG/ACT NA SUSP: 2.0000 | Freq: Every day | NASAL | 0 refills | Status: DC

## 2015-09-30 MED ORDER — HYDROCODONE-HOMATROPINE 5-1.5 MG/5ML PO SYRP: 5.0000 mL | ORAL_SOLUTION | Freq: Every evening | ORAL | 0 refills | Status: DC | PRN

## 2015-09-30 MED ORDER — OXYMETAZOLINE HCL 0.05 % NA SOLN: 1.0000 | Freq: Two times a day (BID) | NASAL | 0 refills | Status: DC

## 2015-09-30 NOTE — Addendum Note (Signed)
Addended by: Ander Slade on: 09/30/2015 04:00 PM   Modules accepted: Orders

## 2015-09-30 NOTE — Patient Instructions (Signed)
Flonase and Afrin use: apply 1spray of afrin in each nare, wait 60mins, then apply 2sprays of flonase in each nare. Use both nasal spray consecutively x 3days, then flonase only for at least 14days.  Start oral antibiotics on Saturday if no improvement.  URI Instructions:  Use over-the-counter  "cold" medicines  such as "Tylenol cold" , "Advil cold",  "Mucinex" or" Mucinex D"  for cough and congestion. Avoid decongestants if you have high blood pressure and use "Afrin" nasal spray for nasal congestion as directed instead. Use" Delsym" or" Robitussin" cough syrup varietis for cough.  You can use plain "Tylenol" or "Advi"l for fever, chills and achyness.   "Common cold" symptoms are usually triggered by a virus.  The antibiotics are usually not necessary. On average, a" viral cold" illness would take 4-7 days to resolve. Please, make an appointment if you are not better or if you're worse.

## 2015-09-30 NOTE — Progress Notes (Signed)
Subjective:  Patient ID: Heather Huynh, female    DOB: 04-12-1955  Age: 60 y.o. MRN: [id number]  CC: URI (since tuesday)   URI   This is a new problem. The current episode started in the past 7 days. The problem has been gradually worsening. There has been no fever (reports chills). Associated symptoms include congestion, coughing, a plugged ear sensation, rhinorrhea, sinus pain, a sore throat and swollen glands. Pertinent negatives include no abdominal pain, chest pain, ear pain, headaches, joint pain, joint swelling, nausea, neck pain, rash, sneezing, vomiting or wheezing. She has tried acetaminophen, decongestant, increased fluids and NSAIDs for the symptoms. The treatment provided mild relief.   Heather Huynh presents for URI symptoms Has upcoming triathelon and is worried she might not be able to complete. Has been able to train despite her symptoms, just feels more tried after. Cough is worse at might.  Outpatient Medications Prior to Visit  Medication Sig Dispense Refill  . blood glucose meter kit and supplies KIT Dispense based on patient and insurance preference. Use up to four times daily as directed. For hypoglycemia 1 each 0  . diclofenac (VOLTAREN) 75 MG EC tablet     . empagliflozin (JARDIANCE) 10 MG TABS tablet Take 10 mg by mouth daily. 30 tablet 3  . Ferrous Sulfate (IRON) 325 (65 FE) MG TABS Take 1 tablet by mouth daily.    Marland Kitchen omeprazole (PRILOSEC) 20 MG capsule Take 1 capsule (20 mg total) by mouth 2 (two) times daily before a meal. 180 capsule 2   No facility-administered medications prior to visit.     ROS Review of Systems  HENT: Positive for congestion, rhinorrhea and sore throat. Negative for ear pain and sneezing.   Respiratory: Positive for cough. Negative for wheezing.   Cardiovascular: Negative for chest pain.  Gastrointestinal: Negative for abdominal pain, nausea and vomiting.  Musculoskeletal: Negative for joint pain and neck pain.  Skin: Negative  for rash.  Neurological: Negative for headaches.    Objective:  BP 110/62   Pulse 76   Temp 98.2 F (36.8 C)   SpO2 98%   BP Readings from Last 3 Encounters:  09/30/15 110/62  06/09/15 100/60  01/19/15 90/60    Wt Readings from Last 3 Encounters:  06/09/15 140 lb (63.5 kg)  01/19/15 140 lb (63.5 kg)  08/14/13 139 lb (63 kg)    Physical Exam  Constitutional: She appears well-developed. No distress.  HENT:  Right Ear: Tympanic membrane and ear canal normal.  Left Ear: Tympanic membrane and ear canal normal.  Nose: Mucosal edema and rhinorrhea present. No sinus tenderness. Right sinus exhibits maxillary sinus tenderness. Right sinus exhibits no frontal sinus tenderness. Left sinus exhibits maxillary sinus tenderness. Left sinus exhibits no frontal sinus tenderness.  Mouth/Throat: Uvula is midline. No trismus in the jaw. Posterior oropharyngeal erythema present. No oropharyngeal exudate or posterior oropharyngeal edema.  Neck: No thyromegaly present.  Cardiovascular: Normal rate.   Pulmonary/Chest: Effort normal and breath sounds normal. No stridor. No respiratory distress. She exhibits no tenderness.  Lymphadenopathy:    She has cervical adenopathy.  Vitals reviewed.   No results found for: WBC, HGB, HCT, PLT, GLUCOSE, CHOL, TRIG, HDL, LDLDIRECT, LDLCALC, ALT, AST, NA, K, CL, CREATININE, BUN, CO2, TSH, PSA, INR, GLUF, HGBA1C, MICROALBUR  Mm Digital Screening Bilateral  Result Date: 07/22/2014 CLINICAL DATA:  Screening. EXAM: DIGITAL SCREENING BILATERAL MAMMOGRAM WITH CAD COMPARISON:  Previous exam(s). ACR Breast Density Category c: The breast tissue is heterogeneously  dense, which may obscure small masses. FINDINGS: There are no findings suspicious for malignancy. Images were processed with CAD. IMPRESSION: No mammographic evidence of malignancy. A result letter of this screening mammogram will be mailed directly to the patient. RECOMMENDATION: Screening mammogram in one year.  (Code:SM-B-01Y) BI-RADS CATEGORY  1: Negative. Electronically Signed   By: Pamelia Hoit M.D.   On: 07/22/2014 14:14    Assessment & Plan:   Kenia was seen today for uri.  Diagnoses and all orders for this visit:  Acute URI -     methylPREDNISolone acetate (DEPO-MEDROL) injection 80 mg; Inject 1 mL (80 mg total) into the muscle once. -     dextromethorphan-guaiFENesin (MUCINEX DM) 30-600 MG 12hr tablet; Take 1 tablet by mouth 2 (two) times daily as needed for cough. -     HYDROcodone-homatropine (HYCODAN) 5-1.5 MG/5ML syrup; Take 5 mLs by mouth at bedtime as needed for cough. -     oxymetazoline (AFRIN NASAL SPRAY) 0.05 % nasal spray; Place 1 spray into both nostrils 2 (two) times daily. Use only for 3days, then stop -     fluticasone (FLONASE) 50 MCG/ACT nasal spray; Place 2 sprays into both nostrils daily. -     Discontinue: doxycycline (VIBRA-TABS) 100 MG tablet; Take 1 tablet (100 mg total) by mouth 2 (two) times daily. -     doxycycline (VIBRA-TABS) 100 MG tablet; Take 1 tablet (100 mg total) by mouth 2 (two) times daily.   I am having Ms. Pourciau start on dextromethorphan-guaiFENesin, HYDROcodone-homatropine, oxymetazoline, and fluticasone. I am also having her maintain her Iron, diclofenac, empagliflozin, blood glucose meter kit and supplies, omeprazole, and doxycycline. We will continue to administer methylPREDNISolone acetate.  Meds ordered this encounter  Medications  . methylPREDNISolone acetate (DEPO-MEDROL) injection 80 mg  . dextromethorphan-guaiFENesin (MUCINEX DM) 30-600 MG 12hr tablet    Sig: Take 1 tablet by mouth 2 (two) times daily as needed for cough.    Dispense:  14 tablet    Refill:  0    Order Specific Question:   Supervising Provider    Answer:   Cassandria Anger [[id number]]  . HYDROcodone-homatropine (HYCODAN) 5-1.5 MG/5ML syrup    Sig: Take 5 mLs by mouth at bedtime as needed for cough.    Dispense:  120 mL    Refill:  0    Order Specific Question:    Supervising Provider    Answer:   Cassandria Anger [[id number]]  . oxymetazoline (AFRIN NASAL SPRAY) 0.05 % nasal spray    Sig: Place 1 spray into both nostrils 2 (two) times daily. Use only for 3days, then stop    Dispense:  30 mL    Refill:  0    Order Specific Question:   Supervising Provider    Answer:   Cassandria Anger [[id number]]  . fluticasone (FLONASE) 50 MCG/ACT nasal spray    Sig: Place 2 sprays into both nostrils daily.    Dispense:  16 g    Refill:  0    Order Specific Question:   Supervising Provider    Answer:   Cassandria Anger [[id number]]  . DISCONTD: doxycycline (VIBRA-TABS) 100 MG tablet    Sig: Take 1 tablet (100 mg total) by mouth 2 (two) times daily.    Dispense:  14 tablet    Refill:  0    Order Specific Question:   Supervising Provider    Answer:   Cassandria Anger [[id number]]  . doxycycline (VIBRA-TABS) 100 MG tablet  Sig: Take 1 tablet (100 mg total) by mouth 2 (two) times daily.    Dispense:  14 tablet    Refill:  0    Order Specific Question:   Supervising Provider    Answer:   Cassandria Anger [[id number]]     Follow-up: Return if symptoms worsen or fail to improve.  Wilfred Lacy, NP

## 2015-09-30 NOTE — Telephone Encounter (Signed)
Patient is currently at [facility] on [address].  Doxycycline was sent to incorrect pharmacy.  Patient is requesting script to be sent as soon as possible.  Patient is going to try to wait at [place] for the script.

## 2015-10-04 MED ORDER — DOXYCYCLINE HYCLATE 100 MG PO TABS: 100.0000 mg | ORAL_TABLET | Freq: Two times a day (BID) | ORAL | 0 refills | Status: AC

## 2015-10-04 NOTE — Telephone Encounter (Signed)
Spoke with pts husband to inform that RX was sent to local [facility]

## 2015-10-05 ENCOUNTER — Telehealth: Payer: Self-pay | Admitting: Nurse Practitioner

## 2015-10-05 NOTE — Telephone Encounter (Signed)
I contacted patient to veryfied if she was allergic doxycycline as stated by fax document from pharmacy: [facility] on [address], [phone]. Person on Phone sated her name was Heather Huynh but declined to verify her DOB. I identified myself with full name, credentials, name of office, and reason for may call (medication allergy discrepancy); but she still declined to verify her DOB. Therefore I was unable to completely discuss the reason for my call.

## 2015-10-27 DIAGNOSIS — S62657A Nondisplaced fracture of medial phalanx of left little finger, initial encounter for closed fracture: Secondary | ICD-10-CM | POA: Diagnosis not present

## 2015-10-27 DIAGNOSIS — M7989 Other specified soft tissue disorders: Secondary | ICD-10-CM | POA: Diagnosis not present

## 2015-10-27 DIAGNOSIS — S62655A Nondisplaced fracture of medial phalanx of left ring finger, initial encounter for closed fracture: Secondary | ICD-10-CM | POA: Diagnosis not present

## 2015-10-27 DIAGNOSIS — S63259A Unspecified dislocation of unspecified finger, initial encounter: Secondary | ICD-10-CM | POA: Diagnosis not present

## 2015-10-27 DIAGNOSIS — S63635A Sprain of interphalangeal joint of left ring finger, initial encounter: Secondary | ICD-10-CM | POA: Diagnosis not present

## 2015-11-22 DIAGNOSIS — H16143 Punctate keratitis, bilateral: Secondary | ICD-10-CM | POA: Diagnosis not present

## 2015-11-22 DIAGNOSIS — S63279A Dislocation of unspecified interphalangeal joint of unspecified finger, initial encounter: Secondary | ICD-10-CM | POA: Diagnosis not present

## 2015-11-22 DIAGNOSIS — L718 Other rosacea: Secondary | ICD-10-CM | POA: Insufficient documentation

## 2015-11-22 DIAGNOSIS — H0289 Other specified disorders of eyelid: Secondary | ICD-10-CM | POA: Diagnosis not present

## 2015-11-23 DIAGNOSIS — S63289A Dislocation of proximal interphalangeal joint of unspecified finger, initial encounter: Secondary | ICD-10-CM | POA: Diagnosis not present

## 2015-11-23 DIAGNOSIS — M79645 Pain in left finger(s): Secondary | ICD-10-CM | POA: Diagnosis not present

## 2015-11-30 DIAGNOSIS — M5136 Other intervertebral disc degeneration, lumbar region: Secondary | ICD-10-CM | POA: Diagnosis not present

## 2015-11-30 DIAGNOSIS — M9903 Segmental and somatic dysfunction of lumbar region: Secondary | ICD-10-CM | POA: Diagnosis not present

## 2015-12-06 DIAGNOSIS — M795 Residual foreign body in soft tissue: Secondary | ICD-10-CM | POA: Diagnosis not present

## 2016-02-01 DIAGNOSIS — H16143 Punctate keratitis, bilateral: Secondary | ICD-10-CM | POA: Diagnosis not present

## 2016-02-01 DIAGNOSIS — H16223 Keratoconjunctivitis sicca, not specified as Sjogren's, bilateral: Secondary | ICD-10-CM | POA: Diagnosis not present

## 2016-02-01 DIAGNOSIS — L718 Other rosacea: Secondary | ICD-10-CM | POA: Diagnosis not present

## 2016-02-01 DIAGNOSIS — H0289 Other specified disorders of eyelid: Secondary | ICD-10-CM | POA: Diagnosis not present

## 2016-02-03 DIAGNOSIS — M9903 Segmental and somatic dysfunction of lumbar region: Secondary | ICD-10-CM | POA: Diagnosis not present

## 2016-02-03 DIAGNOSIS — M5136 Other intervertebral disc degeneration, lumbar region: Secondary | ICD-10-CM | POA: Diagnosis not present

## 2016-02-15 DIAGNOSIS — M25561 Pain in right knee: Secondary | ICD-10-CM | POA: Diagnosis not present

## 2016-05-04 ENCOUNTER — Telehealth: Payer: Self-pay | Admitting: Internal Medicine

## 2016-05-04 ENCOUNTER — Encounter: Payer: Self-pay | Admitting: Nurse Practitioner

## 2016-05-04 ENCOUNTER — Ambulatory Visit (INDEPENDENT_AMBULATORY_CARE_PROVIDER_SITE_OTHER): Payer: BLUE CROSS/BLUE SHIELD | Admitting: Nurse Practitioner

## 2016-05-04 VITALS — BP 110/70 | HR 74 | Temp 97.6°F | Ht 62.0 in | Wt 142.0 lb

## 2016-05-04 DIAGNOSIS — J209 Acute bronchitis, unspecified: Secondary | ICD-10-CM | POA: Diagnosis not present

## 2016-05-04 DIAGNOSIS — J069 Acute upper respiratory infection, unspecified: Secondary | ICD-10-CM | POA: Diagnosis not present

## 2016-05-04 MED ORDER — DM-GUAIFENESIN ER 30-600 MG PO TB12: 1.0000 | ORAL_TABLET | Freq: Two times a day (BID) | ORAL | 0 refills | Status: DC | PRN

## 2016-05-04 MED ORDER — DOXYCYCLINE HYCLATE 100 MG PO TABS: 100.0000 mg | ORAL_TABLET | Freq: Two times a day (BID) | ORAL | 0 refills | Status: AC

## 2016-05-04 MED ORDER — IPRATROPIUM BROMIDE 0.03 % NA SOLN: 2.0000 | Freq: Two times a day (BID) | NASAL | 0 refills | Status: DC

## 2016-05-04 MED ORDER — METHYLPREDNISOLONE ACETATE 40 MG/ML IJ SUSP
40.0000 mg | Freq: Once | INTRAMUSCULAR | Status: AC
Start: 2016-05-04 — End: 2016-05-04
  Administered 2016-05-04: 40 mg via INTRAMUSCULAR

## 2016-05-04 MED ORDER — HYDROCODONE-HOMATROPINE 5-1.5 MG/5ML PO SYRP: 5.0000 mL | ORAL_SOLUTION | Freq: Every evening | ORAL | 0 refills | Status: DC | PRN

## 2016-05-04 NOTE — Progress Notes (Signed)
Subjective:  Patient ID: Heather Huynh, female    DOB: 11-Jul-1955  Age: 61 y.o. MRN: [id number]  CC: Cough (coughing for 3 wks. )   Cough  This is a new problem. The current episode started 1 to 4 weeks ago. The problem has been unchanged. The cough is productive of sputum. Associated symptoms include nasal congestion, postnasal drip and rhinorrhea. Pertinent negatives include no chest pain, chills, ear congestion, ear pain, fever, headaches, sore throat, shortness of breath or wheezing. The symptoms are aggravated by cold air and lying down. She has tried OTC cough suppressant for the symptoms. The treatment provided no relief.    Outpatient Medications Prior to Visit  Medication Sig Dispense Refill  . blood glucose meter kit and supplies KIT Dispense based on patient and insurance preference. Use up to four times daily as directed. For hypoglycemia 1 each 0  . diclofenac (VOLTAREN) 75 MG EC tablet     . Ferrous Sulfate (IRON) 325 (65 FE) MG TABS Take 1 tablet by mouth daily.    Marland Kitchen dextromethorphan-guaiFENesin (MUCINEX DM) 30-600 MG 12hr tablet Take 1 tablet by mouth 2 (two) times daily as needed for cough. (Patient not taking: Reported on 05/04/2016) 14 tablet 0  . empagliflozin (JARDIANCE) 10 MG TABS tablet Take 10 mg by mouth daily. (Patient not taking: Reported on 05/04/2016) 30 tablet 3  . fluticasone (FLONASE) 50 MCG/ACT nasal spray Place 2 sprays into both nostrils daily. (Patient not taking: Reported on 05/04/2016) 16 g 0  . HYDROcodone-homatropine (HYCODAN) 5-1.5 MG/5ML syrup Take 5 mLs by mouth at bedtime as needed for cough. (Patient not taking: Reported on 05/04/2016) 120 mL 0  . omeprazole (PRILOSEC) 20 MG capsule Take 1 capsule (20 mg total) by mouth 2 (two) times daily before a meal. (Patient not taking: Reported on 05/04/2016) 180 capsule 2  . oxymetazoline (AFRIN NASAL SPRAY) 0.05 % nasal spray Place 1 spray into both nostrils 2 (two) times daily. Use only for 3days, then stop  (Patient not taking: Reported on 05/04/2016) 30 mL 0  . methylPREDNISolone acetate (DEPO-MEDROL) injection 80 mg      No facility-administered medications prior to visit.     ROS See HPI  Objective:  BP 110/70   Pulse 74   Temp 97.6 F (36.4 C)   Ht _0  (1.575 m)   Wt 142 lb (64.4 kg)   SpO2 98%   BMI 25.97 kg/m   BP Readings from Last 3 Encounters:  05/04/16 110/70  09/30/15 110/62  06/09/15 100/60    Wt Readings from Last 3 Encounters:  05/04/16 142 lb (64.4 kg)  06/09/15 140 lb (63.5 kg)  01/19/15 140 lb (63.5 kg)    Physical Exam  Constitutional: She is oriented to person, place, and time.  HENT:  Right Ear: Tympanic membrane, external ear and ear canal normal.  Left Ear: Tympanic membrane, external ear and ear canal normal.  Nose: Mucosal edema and rhinorrhea present. Right sinus exhibits no maxillary sinus tenderness and no frontal sinus tenderness. Left sinus exhibits no maxillary sinus tenderness and no frontal sinus tenderness.  Mouth/Throat: Uvula is midline. No trismus in the jaw. Posterior oropharyngeal erythema present. No oropharyngeal exudate.  Eyes: No scleral icterus.  Neck: Normal range of motion. Neck supple.  Cardiovascular: Normal rate and normal heart sounds.   Pulmonary/Chest: Effort normal and breath sounds normal. No respiratory distress.  Musculoskeletal: She exhibits no edema.  Lymphadenopathy:    She has no cervical adenopathy.  Neurological:  She is alert and oriented to person, place, and time.  Vitals reviewed.   No results found for: WBC, HGB, HCT, PLT, GLUCOSE, CHOL, TRIG, HDL, LDLDIRECT, LDLCALC, ALT, AST, NA, K, CL, CREATININE, BUN, CO2, TSH, PSA, INR, GLUF, HGBA1C, MICROALBUR  Mm Digital Screening Bilateral  Result Date: 07/22/2014 CLINICAL DATA:  Screening. EXAM: DIGITAL SCREENING BILATERAL MAMMOGRAM WITH CAD COMPARISON:  Previous exam(s). ACR Breast Density Category c: The breast tissue is heterogeneously dense, which may  obscure small masses. FINDINGS: There are no findings suspicious for malignancy. Images were processed with CAD. IMPRESSION: No mammographic evidence of malignancy. A result letter of this screening mammogram will be mailed directly to the patient. RECOMMENDATION: Screening mammogram in one year. (Code:SM-B-01Y) BI-RADS CATEGORY  1: Negative. Electronically Signed   By: Pamelia Hoit M.D.   On: 07/22/2014 14:14    Assessment & Plan:   Heather Huynh was seen today for cough.  Diagnoses and all orders for this visit:  Acute bronchitis, unspecified organism -     methylPREDNISolone acetate (DEPO-MEDROL) injection 40 mg; Inject 1 mL (40 mg total) into the muscle once. -     doxycycline (VIBRA-TABS) 100 MG tablet; Take 1 tablet (100 mg total) by mouth 2 (two) times daily.  Acute URI -     dextromethorphan-guaiFENesin (MUCINEX DM) 30-600 MG 12hr tablet; Take 1 tablet by mouth 2 (two) times daily as needed for cough. -     methylPREDNISolone acetate (DEPO-MEDROL) injection 40 mg; Inject 1 mL (40 mg total) into the muscle once. -     HYDROcodone-homatropine (HYCODAN) 5-1.5 MG/5ML syrup; Take 5 mLs by mouth at bedtime as needed for cough. -     ipratropium (ATROVENT) 0.03 % nasal spray; Place 2 sprays into both nostrils 2 (two) times daily. Do not use for more than 5days. -     doxycycline (VIBRA-TABS) 100 MG tablet; Take 1 tablet (100 mg total) by mouth 2 (two) times daily.   I have discontinued Heather Huynh's empagliflozin, omeprazole, oxymetazoline, and fluticasone. I am also having her start on ipratropium and doxycycline. Additionally, I am having her maintain her Iron, diclofenac, blood glucose meter kit and supplies, dextromethorphan-guaiFENesin, and HYDROcodone-homatropine. We will stop administering methylPREDNISolone acetate. Additionally, we administered methylPREDNISolone acetate.  Meds ordered this encounter  Medications  . dextromethorphan-guaiFENesin (MUCINEX DM) 30-600 MG 12hr tablet    Sig:  Take 1 tablet by mouth 2 (two) times daily as needed for cough.    Dispense:  14 tablet    Refill:  0    Order Specific Question:   Supervising Provider    Answer:   Cassandria Anger [[id number]]  . methylPREDNISolone acetate (DEPO-MEDROL) injection 40 mg  . HYDROcodone-homatropine (HYCODAN) 5-1.5 MG/5ML syrup    Sig: Take 5 mLs by mouth at bedtime as needed for cough.    Dispense:  120 mL    Refill:  0    Order Specific Question:   Supervising Provider    Answer:   Cassandria Anger [[id number]]  . ipratropium (ATROVENT) 0.03 % nasal spray    Sig: Place 2 sprays into both nostrils 2 (two) times daily. Do not use for more than 5days.    Dispense:  30 mL    Refill:  0    Order Specific Question:   Supervising Provider    Answer:   Cassandria Anger [[id number]]  . doxycycline (VIBRA-TABS) 100 MG tablet    Sig: Take 1 tablet (100 mg total) by mouth 2 (two) times daily.  Dispense:  14 tablet    Refill:  0    Order Specific Question:   Supervising Provider    Answer:   Cassandria Anger [[id number]]    Follow-up: Return if symptoms worsen or fail to improve.  Wilfred Lacy, NP

## 2016-05-04 NOTE — Progress Notes (Signed)
Pre visit review using our clinic review tool, if applicable. No additional management support is needed unless otherwise documented below in the visit note. 

## 2016-05-04 NOTE — Telephone Encounter (Signed)
LVM for pt to call back and discuss.  

## 2016-05-04 NOTE — Patient Instructions (Addendum)
Start oral antibiotics if no improvement in 3days.  Encourage adequate oral hydration.

## 2016-05-04 NOTE — Telephone Encounter (Signed)
Pt would like you to call her regarding Jardiance.  Pt saw Baldo Ash today for an acute visit.  Please call pt on her cell phone.

## 2016-05-05 NOTE — Telephone Encounter (Signed)
LVM on both home and mobile number for pt to call backi and discuss.

## 2016-05-05 NOTE — Telephone Encounter (Signed)
Not on medication 

## 2016-05-11 DIAGNOSIS — M79645 Pain in left finger(s): Secondary | ICD-10-CM | POA: Diagnosis not present

## 2016-05-11 DIAGNOSIS — S63289A Dislocation of proximal interphalangeal joint of unspecified finger, initial encounter: Secondary | ICD-10-CM | POA: Diagnosis not present

## 2016-05-17 DIAGNOSIS — M25649 Stiffness of unspecified hand, not elsewhere classified: Secondary | ICD-10-CM | POA: Diagnosis not present

## 2016-05-17 DIAGNOSIS — S63289D Dislocation of proximal interphalangeal joint of unspecified finger, subsequent encounter: Secondary | ICD-10-CM | POA: Diagnosis not present

## 2016-05-17 DIAGNOSIS — M25442 Effusion, left hand: Secondary | ICD-10-CM | POA: Diagnosis not present

## 2016-05-17 DIAGNOSIS — M79645 Pain in left finger(s): Secondary | ICD-10-CM | POA: Diagnosis not present

## 2016-06-07 DIAGNOSIS — M25442 Effusion, left hand: Secondary | ICD-10-CM | POA: Diagnosis not present

## 2016-06-07 DIAGNOSIS — S63289D Dislocation of proximal interphalangeal joint of unspecified finger, subsequent encounter: Secondary | ICD-10-CM | POA: Diagnosis not present

## 2016-06-07 DIAGNOSIS — M25649 Stiffness of unspecified hand, not elsewhere classified: Secondary | ICD-10-CM | POA: Diagnosis not present

## 2016-06-22 ENCOUNTER — Ambulatory Visit: Payer: BLUE CROSS/BLUE SHIELD | Admitting: Family Medicine

## 2016-06-22 ENCOUNTER — Encounter: Payer: Self-pay | Admitting: Sports Medicine

## 2016-06-22 ENCOUNTER — Ambulatory Visit (INDEPENDENT_AMBULATORY_CARE_PROVIDER_SITE_OTHER): Payer: BLUE CROSS/BLUE SHIELD | Admitting: Sports Medicine

## 2016-06-22 DIAGNOSIS — M7660 Achilles tendinitis, unspecified leg: Secondary | ICD-10-CM | POA: Diagnosis not present

## 2016-06-22 MED ORDER — NITROGLYCERIN 0.2 MG/HR TD PT24: MEDICATED_PATCH | TRANSDERMAL | 1 refills | Status: DC

## 2016-06-22 NOTE — Progress Notes (Signed)
   Subjective:    Patient ID: Heather Huynh, female    DOB: Jun 20, 1955, 61 y.o.   MRN: [id number]  HPI chief complaint: Right Achilles pain  Show comes in today complaining of 5 weeks of a dull ache in the right Achilles tendon. She has a history of previous Achilles tendinopathy and partial tearing which responded to several weeks of topical nitroglycerin and eccentric exercises. She has resumed doing her exercises and as a result her pain is not as severe. She localizes it to the mid substance of the Achilles tendon. She has not noticed any swelling. She denies any trauma. Her symptoms are identical to what she experienced previously but not as severe. She has pain primarily with walking. She is a former runner but has not been able to run due to chronic knee pain which is treated by Dr. Noemi Chapel.  Interim medical history reviewed Medications reviewed Allergies reviewed    Review of Systems As above    Objective:   Physical Exam  Well-developed, well-nourished. No acute distress. Awake alert and oriented 3. Vital signs reviewed  Left Achilles: Minimal tenderness to palpation. No pain with Achilles stretch. No swelling. No obvious thickening. She has a cavus foot. Neurovascularly intact distally. Walking without a limp.  Limited MSK ultrasound of the left Achilles tendon shows slight edema within the mid substance of the Achilles tendon but no thickening and no obvious tears.      Assessment & Plan:   Left Achilles tendinitis  I reviewed the patient's home exercise program with her. She is currently doing both concentric and eccentric strengthening of her Achilles tendon. I will have her change this to eccentric strengthening only until she is pain-free and at that point she can add in concentric strengthening. I want to give her a 3/16 inch heel lift to put in her tennis shoes. Although I do not see any obvious tearing on her ultrasound, she has had excellent success in the past  with topical nitroglycerin. We will go ahead and start a short course (1 month) of treatment with this and she can discontinue the nitroglycerin when pain-free. As long she is able to return to her previous level of activity with resolution of her symptoms she can follow-up with me as needed.

## 2016-06-29 DIAGNOSIS — M25561 Pain in right knee: Secondary | ICD-10-CM | POA: Diagnosis not present

## 2016-07-07 ENCOUNTER — Encounter: Payer: Self-pay | Admitting: Family Medicine

## 2016-07-07 ENCOUNTER — Ambulatory Visit (INDEPENDENT_AMBULATORY_CARE_PROVIDER_SITE_OTHER): Payer: BLUE CROSS/BLUE SHIELD | Admitting: Family Medicine

## 2016-07-07 VITALS — BP 100/58 | Ht 62.0 in | Wt 133.0 lb

## 2016-07-07 DIAGNOSIS — M222X1 Patellofemoral disorders, right knee: Secondary | ICD-10-CM | POA: Diagnosis not present

## 2016-07-07 DIAGNOSIS — M7661 Achilles tendinitis, right leg: Secondary | ICD-10-CM | POA: Diagnosis not present

## 2016-07-07 DIAGNOSIS — M216X9 Other acquired deformities of unspecified foot: Secondary | ICD-10-CM

## 2016-07-07 NOTE — Progress Notes (Signed)
  Heather Huynh - 61 y.o. female MRN [id number]  Date of birth: 1955-07-27  SUBJECTIVE:  Including CC & ROS.  CC: right achilles pain  Patient presents for orthotics. She has known high arches and has trouble with her Achilles tendon. She has been having knee pain that has been bothering her now is thought it was due to her abnormal gait.  She has stopped running but still is active on her bike and swimming. She is doing her exercises for her right Achilles pain and feeling better when she does them.    ROS: No unexpected weight loss, fever, chills, swelling, instability, muscle pain, numbness/tingling, redness, otherwise see HPI   PMHx - Updated and reviewed.  Contributory factors include: Negative PSHx - Updated and reviewed.  Contributory factors include:  Negative FHx - Updated and reviewed.  Contributory factors include:  Negative Social Hx - Updated and reviewed. Contributory factors include: Negative Medications - reviewed   DATA REVIEWED:  previous office visits showing right Achilles tendinitis last on 06/22/2016  PHYSICAL EXAM:  VS: BP:(!) 100/58  HR: bpm  TEMP: ( )  RESP:   HT:5\' 2"  (157.5 cm)   WT:133 lb (60.3 kg)  BMI:24.4 PHYSICAL EXAM: Gen: NAD, alert, cooperative with exam, well-appearing HEENT: clear conjunctiva,  CV:  no edema, capillary refill brisk, normal rate Resp: non-labored Skin: no rashes, normal turgor  Neuro: no gross deficits.  Psych:  alert and oriented Ankle & Foot: No visible swelling, ecchymosis, erythema, ulcers, calluses, blister Arch: Normal w/o pes cavus or planus  Achilles tendon without nodules or tenderness No swelling of retrocalcaneal bursa No pain at MT heads No pain at base of 5th MT; No tenderness over cuboid; No tenderness over N spot or navicular prominence No tenderness on lateral and medial malleolus No sign of peroneal tendon subluxations or tenderness to palpation Full in plantarflexion, dorsiflexion, inversion, and  eversion of the foot; flexion and extension of the toes Strength: 5/5 in all directions. Sensation: intact Vascular: intact w/ dorsalis pedis & posterior tibialis pulses 2+ Stable lateral and medial ligaments; Negative Anterior drawer test    ASSESSMENT & PLAN:   CAVUS DEFORMITY OF FOOT, ACQUIRED Orthotics made for patient. She tolerated these well.  See below.  Right Achilles tendinitis Much improved. Continue exercises and custom orthotics made. Follow-up as needed.  Patient was fitted for a standard, cushioned, semi-rigid orthotic. The orthotic was heated and afterward the patient stood on the orthotic blank positioned on the orthotic stand. The patient was positioned in subtalar neutral position and 10 degrees of ankle dorsiflexion in a weight bearing stance. After completion of molding, a stable base was applied to the orthotic blank. The blank was ground to a stable position for weight bearing. Size: 7 Base: Blue EVA Additional Posting and Padding: None The patient ambulated these, and they were very comfortable.  I spent 40 minutes with this patient, greater than 50% was face-to-face time counseling regarding the above diagnosis.

## 2016-07-07 NOTE — Assessment & Plan Note (Signed)
Orthotics made for patient. She tolerated these well.  See below.

## 2016-07-07 NOTE — Assessment & Plan Note (Signed)
Much improved. Continue exercises and custom orthotics made. Follow-up as needed.

## 2016-08-07 DIAGNOSIS — M25442 Effusion, left hand: Secondary | ICD-10-CM | POA: Diagnosis not present

## 2016-08-07 DIAGNOSIS — M25649 Stiffness of unspecified hand, not elsewhere classified: Secondary | ICD-10-CM | POA: Diagnosis not present

## 2016-08-07 DIAGNOSIS — S63289D Dislocation of proximal interphalangeal joint of unspecified finger, subsequent encounter: Secondary | ICD-10-CM | POA: Diagnosis not present

## 2016-09-18 DIAGNOSIS — M25442 Effusion, left hand: Secondary | ICD-10-CM | POA: Diagnosis not present

## 2016-09-18 DIAGNOSIS — M25649 Stiffness of unspecified hand, not elsewhere classified: Secondary | ICD-10-CM | POA: Diagnosis not present

## 2016-09-18 DIAGNOSIS — S63289D Dislocation of proximal interphalangeal joint of unspecified finger, subsequent encounter: Secondary | ICD-10-CM | POA: Diagnosis not present

## 2016-11-02 DIAGNOSIS — S83281A Other tear of lateral meniscus, current injury, right knee, initial encounter: Secondary | ICD-10-CM | POA: Diagnosis not present

## 2016-11-10 DIAGNOSIS — M25562 Pain in left knee: Secondary | ICD-10-CM | POA: Diagnosis not present

## 2016-12-07 DIAGNOSIS — M1711 Unilateral primary osteoarthritis, right knee: Secondary | ICD-10-CM | POA: Diagnosis not present

## 2016-12-11 DIAGNOSIS — M9903 Segmental and somatic dysfunction of lumbar region: Secondary | ICD-10-CM | POA: Diagnosis not present

## 2016-12-11 DIAGNOSIS — M5136 Other intervertebral disc degeneration, lumbar region: Secondary | ICD-10-CM | POA: Diagnosis not present

## 2016-12-14 DIAGNOSIS — M1711 Unilateral primary osteoarthritis, right knee: Secondary | ICD-10-CM | POA: Diagnosis not present

## 2016-12-21 DIAGNOSIS — M5136 Other intervertebral disc degeneration, lumbar region: Secondary | ICD-10-CM | POA: Diagnosis not present

## 2016-12-21 DIAGNOSIS — M9903 Segmental and somatic dysfunction of lumbar region: Secondary | ICD-10-CM | POA: Diagnosis not present

## 2016-12-21 DIAGNOSIS — M1711 Unilateral primary osteoarthritis, right knee: Secondary | ICD-10-CM | POA: Diagnosis not present

## 2017-01-01 DIAGNOSIS — M5136 Other intervertebral disc degeneration, lumbar region: Secondary | ICD-10-CM | POA: Diagnosis not present

## 2017-01-01 DIAGNOSIS — M9903 Segmental and somatic dysfunction of lumbar region: Secondary | ICD-10-CM | POA: Diagnosis not present

## 2017-01-15 DIAGNOSIS — M9903 Segmental and somatic dysfunction of lumbar region: Secondary | ICD-10-CM | POA: Diagnosis not present

## 2017-01-15 DIAGNOSIS — M5136 Other intervertebral disc degeneration, lumbar region: Secondary | ICD-10-CM | POA: Diagnosis not present

## 2017-02-20 DIAGNOSIS — M7582 Other shoulder lesions, left shoulder: Secondary | ICD-10-CM | POA: Diagnosis not present

## 2017-03-19 DIAGNOSIS — H16143 Punctate keratitis, bilateral: Secondary | ICD-10-CM | POA: Diagnosis not present

## 2017-03-19 DIAGNOSIS — H16223 Keratoconjunctivitis sicca, not specified as Sjogren's, bilateral: Secondary | ICD-10-CM | POA: Diagnosis not present

## 2017-03-19 DIAGNOSIS — L718 Other rosacea: Secondary | ICD-10-CM | POA: Diagnosis not present

## 2017-03-19 DIAGNOSIS — H04123 Dry eye syndrome of bilateral lacrimal glands: Secondary | ICD-10-CM | POA: Diagnosis not present

## 2017-05-17 NOTE — Progress Notes (Signed)
Subjective:    Patient ID: Heather Huynh, female    DOB: 1955/02/18, 62 y.o.   MRN: [id number]  HPI She is here for an acute visit for cold symptoms.  Her symptoms started 4 weeks ago  She is experiencing cough was initially producitve and then it was not.  She has a sore throat from coughing.    She denies fever, chills, change in appetite, congestion, ear pain, sinus pain, sob, wheeze, diarrhea, nausea, muscle aches and headaches.     She has taken mints, mucinex, cough syrup.    Medications and allergies reviewed with patient and updated if appropriate.  Patient Active Problem List   Diagnosis Date Noted  . Patellofemoral disorder of right knee 07/07/2016  . Right Achilles tendinitis 07/07/2016  . Pyriformis syndrome, left 01/19/2015  . GERD (gastroesophageal reflux disease) 01/19/2015  . Hypoglycemia 01/19/2015  . 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 blood glucose meter kit and supplies KIT Dispense based on patient and insurance preference. Use up to four times daily as directed. For hypoglycemia 1 each 0  . Ferrous Sulfate (IRON) 325 (65 FE) MG TABS Take 1 tablet by mouth daily.     No current facility-administered medications on file prior to visit.     Past Medical History:  Diagnosis Date  . Achilles bursitis or tendinitis 03/05/2008   Qualifier: Diagnosis of  By: Laurance Flatten CMA, [place]    . Anemia   . GERD (gastroesophageal reflux disease)     Past Surgical History:  Procedure Laterality Date  . ACL    . CESAREAN SECTION    . SHOULDER ARTHROSCOPY Right   . TENDON REPAIR      Social History   Socioeconomic History  . Marital status: Divorced    Spouse name: Not on file  . Number of children: Not on file  . Years of education: Not on file  . Highest education level: Not on file  Occupational History  . Not on file  Social Needs  . Financial  resource strain: Not on file  . Food insecurity:    Worry: Not on file    Inability: Not on file  . Transportation needs:    Medical: Not on file    Non-medical: Not on file  Tobacco Use  . Smoking status: Never Smoker  . Smokeless tobacco: Never Used  Substance and Sexual Activity  . Alcohol use: Yes    Comment: rarely  . Drug use: No  . Sexual activity: Not on file  Lifestyle  . Physical activity:    Days per week: Not on file    Minutes per session: Not on file  . Stress: Not on file  Relationships  . Social connections:    Talks on phone: Not on file    Gets together: Not on file    Attends religious service: Not on file    Active member of club or organization: Not on file    Attends meetings of clubs or organizations: Not on file    Relationship status: Not on file  Other Topics Concern  . Not on file  Social History Narrative   Exercising regularly    Family History  Problem Relation Age of Onset  . Sudden death Neg Hx   . Hypertension Neg Hx   . Hyperlipidemia Neg Hx   . Heart attack Neg Hx   .  Diabetes Neg Hx   . Colon cancer Neg Hx   . Esophageal cancer Neg Hx   . Stomach cancer Neg Hx   . Rectal cancer Neg Hx   . Breast cancer Mother   . Breast cancer Maternal Grandmother   . Heart disease Maternal Grandmother   . Heart disease Maternal Grandfather     Review of Systems  Constitutional: Negative for appetite change, chills and fever.  HENT: Positive for sore throat (from cough). Negative for congestion, ear pain and sinus pain.   Respiratory: Positive for cough. Negative for chest tightness, shortness of breath and wheezing.   Cardiovascular: Negative for chest pain.  Gastrointestinal: Negative for diarrhea and nausea.  Musculoskeletal: Negative for myalgias.  Neurological: Negative for light-headedness and headaches.       Objective:   Vitals:   05/18/17 1032  BP: 94/66  Pulse: 74  Resp: 16  Temp: 98.4 F (36.9 C)  SpO2: 98%   Filed  Weights   05/18/17 1032  Weight: 145 lb (65.8 kg)   Body mass index is 26.52 kg/m.  Wt Readings from Last 3 Encounters:  05/18/17 145 lb (65.8 kg)  07/07/16 133 lb (60.3 kg)  06/22/16 133 lb (60.3 kg)     Physical Exam GENERAL APPEARANCE: Appears stated age, well appearing, NAD, coughing frequently EYES: conjunctiva clear, no icterus HEENT: bilateral tympanic membranes and ear canals normal, oropharynx with mild erythema, no thyromegaly, trachea midline, no cervical or supraclavicular lymphadenopathy LUNGS: Clear to auscultation without wheeze or crackles, unlabored breathing, good air entry bilaterally CARDIOVASCULAR: Normal S1,S2 without murmurs, no edema SKIN: warm, dry        Assessment & Plan:   See Problem List for Assessment and Plan of chronic medical problems.

## 2017-05-18 ENCOUNTER — Ambulatory Visit: Payer: BLUE CROSS/BLUE SHIELD | Admitting: Internal Medicine

## 2017-05-18 ENCOUNTER — Encounter: Payer: Self-pay | Admitting: Internal Medicine

## 2017-05-18 VITALS — BP 94/66 | HR 74 | Temp 98.4°F | Resp 16 | Wt 145.0 lb

## 2017-05-18 DIAGNOSIS — R05 Cough: Secondary | ICD-10-CM | POA: Diagnosis not present

## 2017-05-18 DIAGNOSIS — R059 Cough, unspecified: Secondary | ICD-10-CM | POA: Insufficient documentation

## 2017-05-18 MED ORDER — DOXYCYCLINE HYCLATE 100 MG PO TABS: 100.0000 mg | ORAL_TABLET | Freq: Two times a day (BID) | ORAL | 0 refills | Status: DC

## 2017-05-18 MED ORDER — METHYLPREDNISOLONE 4 MG PO TBPK: ORAL_TABLET | ORAL | 0 refills | Status: DC

## 2017-05-18 NOTE — Assessment & Plan Note (Signed)
Persistent cough for 4 weeks.  Was initially productive and now less so.  No other symptoms.  There has been no improvement over the last 4 weeks and her cough is significant  Will try doxycycline and medrol dose pak  Call if no improvement

## 2017-05-18 NOTE — Patient Instructions (Addendum)
Take the medications as prescribed.  You can continue the over-the-counter medication for your cough.

## 2017-07-12 DIAGNOSIS — M9903 Segmental and somatic dysfunction of lumbar region: Secondary | ICD-10-CM | POA: Diagnosis not present

## 2017-07-12 DIAGNOSIS — M5136 Other intervertebral disc degeneration, lumbar region: Secondary | ICD-10-CM | POA: Diagnosis not present

## 2017-11-12 DIAGNOSIS — M9903 Segmental and somatic dysfunction of lumbar region: Secondary | ICD-10-CM | POA: Diagnosis not present

## 2017-11-12 DIAGNOSIS — M5136 Other intervertebral disc degeneration, lumbar region: Secondary | ICD-10-CM | POA: Diagnosis not present

## 2017-11-27 DIAGNOSIS — M5136 Other intervertebral disc degeneration, lumbar region: Secondary | ICD-10-CM | POA: Diagnosis not present

## 2017-11-27 DIAGNOSIS — M9903 Segmental and somatic dysfunction of lumbar region: Secondary | ICD-10-CM | POA: Diagnosis not present

## 2017-12-02 DIAGNOSIS — Z883 Allergy status to other anti-infective agents status: Secondary | ICD-10-CM | POA: Diagnosis not present

## 2017-12-02 DIAGNOSIS — S0081XA Abrasion of other part of head, initial encounter: Secondary | ICD-10-CM | POA: Diagnosis not present

## 2017-12-02 DIAGNOSIS — M4312 Spondylolisthesis, cervical region: Secondary | ICD-10-CM | POA: Diagnosis not present

## 2017-12-02 DIAGNOSIS — R220 Localized swelling, mass and lump, head: Secondary | ICD-10-CM | POA: Diagnosis not present

## 2017-12-02 DIAGNOSIS — M47892 Other spondylosis, cervical region: Secondary | ICD-10-CM | POA: Diagnosis not present

## 2017-12-02 DIAGNOSIS — Z880 Allergy status to penicillin: Secondary | ICD-10-CM | POA: Diagnosis not present

## 2017-12-02 DIAGNOSIS — M5030 Other cervical disc degeneration, unspecified cervical region: Secondary | ICD-10-CM | POA: Diagnosis not present

## 2017-12-02 DIAGNOSIS — G8911 Acute pain due to trauma: Secondary | ICD-10-CM | POA: Diagnosis not present

## 2017-12-02 DIAGNOSIS — S0181XA Laceration without foreign body of other part of head, initial encounter: Secondary | ICD-10-CM | POA: Diagnosis not present

## 2017-12-02 DIAGNOSIS — S0990XA Unspecified injury of head, initial encounter: Secondary | ICD-10-CM | POA: Diagnosis not present

## 2017-12-02 DIAGNOSIS — S199XXA Unspecified injury of neck, initial encounter: Secondary | ICD-10-CM | POA: Diagnosis not present

## 2017-12-07 DIAGNOSIS — Z4802 Encounter for removal of sutures: Secondary | ICD-10-CM | POA: Diagnosis not present

## 2017-12-07 DIAGNOSIS — S0181XD Laceration without foreign body of other part of head, subsequent encounter: Secondary | ICD-10-CM | POA: Diagnosis not present

## 2017-12-13 DIAGNOSIS — M5136 Other intervertebral disc degeneration, lumbar region: Secondary | ICD-10-CM | POA: Diagnosis not present

## 2017-12-13 DIAGNOSIS — M9903 Segmental and somatic dysfunction of lumbar region: Secondary | ICD-10-CM | POA: Diagnosis not present

## 2017-12-17 ENCOUNTER — Ambulatory Visit: Payer: Self-pay

## 2017-12-17 MED ORDER — CIPROFLOXACIN HCL 250 MG PO TABS: 250.0000 mg | ORAL_TABLET | Freq: Two times a day (BID) | ORAL | 0 refills | Status: DC

## 2017-12-17 NOTE — Telephone Encounter (Signed)
Pt aware.

## 2017-12-17 NOTE — Telephone Encounter (Signed)
Please advise as patient is traveling

## 2017-12-17 NOTE — Addendum Note (Signed)
Addended by: Binnie Rail on: 12/17/2017 04:25 PM   Modules accepted: Orders

## 2017-12-17 NOTE — Telephone Encounter (Signed)
rx sent

## 2017-12-17 NOTE — Telephone Encounter (Signed)
Returned call to pt.  Reported onset of lower abdominal discomfort with urination, today, about 9:00 AM.  Also, c/o lower back pain from gluteal fold up to the mid spine.  Also c/o feeling a "pressure" in the right kidney region.  Stated she doesn't have pain in the right kidney, but is aware of where the kidney is, due to the pressure discomfort.  Stated she is urinating in very small amts., and has difficulty emptying her bladder.  Denied any fever/ chills.  Denied odor to urine, but stated the urine color is dark yellow.  Is requesting to have an antibiotic called in to her pharmacy, as she is traveling back from [place] at present, and then flies out again in the morning, at 6:00 AM.    Will send triage note to Dr. Quay Burow.  Also will notify the [facility] re: this Triage note and pt's. situation.     Reason for Disposition . Side (flank) or lower back pain present  Answer Assessment - Initial Assessment Questions 1. SYMPTOM: "What's the main symptom you're concerned about?" (e.g., frequency, incontinence)     Lower abdominal and lower back pain/ pressure  2. ONSET: "When did the  symptoms start?"     About 9:00 AM  3. PAIN: "Is there any pain?" If so, ask: "How bad is it?" (Scale: 1-10; mild, moderate, severe)     Lower abdominal and lower back pain ; can feel pressure on right kidney region 4. CAUSE: "What do you think is causing the symptoms?"     UTI 5. OTHER SYMPTOMS: "Do you have any other symptoms?" (e.g., fever, flank pain, blood in urine, pain with urination)     Denied fever, denied blood in urine; color is dark yellow; lower abdominal discomfort with urination and in between urination; c/o back pain from gluteal fold to mid-spine  6. PREGNANCY: "Is there any chance you are pregnant?" "When was your last menstrual period?"     N/a  Protocols used: URINARY SYMPTOMS-A-AH  ----- Message from Cecelia Byars, NT sent at 12/17/2017 3:41 PM EST ----- Patient called and says she thinks she has a  UTI ,her symptoms are lower back pain and abdominal pain,frequent urination without completely emptying her bladder and she would like something called in to the  [id number] - [place], [place] - [address] [phone] (Phone) [phone] (Fax)  Please call her at [phone] , she is out of town .travels for work and will be leaving in the morning .

## 2017-12-17 NOTE — Telephone Encounter (Signed)
Please advise. She has not seen you in quite some time other than in April for a cough.

## 2018-01-07 DIAGNOSIS — M5136 Other intervertebral disc degeneration, lumbar region: Secondary | ICD-10-CM | POA: Diagnosis not present

## 2018-01-07 DIAGNOSIS — M9903 Segmental and somatic dysfunction of lumbar region: Secondary | ICD-10-CM | POA: Diagnosis not present

## 2018-01-16 DIAGNOSIS — M5136 Other intervertebral disc degeneration, lumbar region: Secondary | ICD-10-CM | POA: Diagnosis not present

## 2018-01-16 DIAGNOSIS — M9903 Segmental and somatic dysfunction of lumbar region: Secondary | ICD-10-CM | POA: Diagnosis not present

## 2018-01-21 DIAGNOSIS — M5136 Other intervertebral disc degeneration, lumbar region: Secondary | ICD-10-CM | POA: Diagnosis not present

## 2018-01-21 DIAGNOSIS — M9903 Segmental and somatic dysfunction of lumbar region: Secondary | ICD-10-CM | POA: Diagnosis not present

## 2018-01-31 DIAGNOSIS — M9903 Segmental and somatic dysfunction of lumbar region: Secondary | ICD-10-CM | POA: Diagnosis not present

## 2018-01-31 DIAGNOSIS — M5136 Other intervertebral disc degeneration, lumbar region: Secondary | ICD-10-CM | POA: Diagnosis not present

## 2018-03-11 DIAGNOSIS — M5136 Other intervertebral disc degeneration, lumbar region: Secondary | ICD-10-CM | POA: Diagnosis not present

## 2018-03-11 DIAGNOSIS — M9903 Segmental and somatic dysfunction of lumbar region: Secondary | ICD-10-CM | POA: Diagnosis not present

## 2018-04-18 DIAGNOSIS — M9903 Segmental and somatic dysfunction of lumbar region: Secondary | ICD-10-CM | POA: Diagnosis not present

## 2018-04-18 DIAGNOSIS — M5136 Other intervertebral disc degeneration, lumbar region: Secondary | ICD-10-CM | POA: Diagnosis not present

## 2018-05-29 NOTE — Progress Notes (Signed)
Virtual Visit via Video Note  I connected with Mickie Bail on 05/30/18 at  9:00 AM EDT by a video enabled telemedicine application and verified that I am speaking with the correct person using two identifiers.   I discussed the limitations of evaluation and management by telemedicine and the availability of in person appointments. The patient expressed understanding and agreed to proceed.  The patient is currently at home and I am in the office.    No referring provider.    History of Present Illness: This is an acute problem visit: Cough/GERD.  Couple of years ago she saw Dr. Fredderick Phenix, and an allergist because she was coughing and she thought she had an allergy.  He diagnosed her with GERD and put her on omeprazole 20 mg daily.  She took this for a couple of years and then stopped it because she did not want to be on a PPI.  Not long ago she had the cough come back.  It is a dry cough and not associated with any other symptoms.  She has never experienced typical heartburn.  She does not have any other allergy symptoms.  She started taking Pepcid Complete and that has helped, but the cough started to increase although it is not as bad as it originally was.  She takes the Pepcid Complete once daily.  She wonders if she needs to go back on the omeprazole.  She is concerned about being on a PPI long-term.  She has no other symptoms and has no other concerns.    Social History   Socioeconomic History  . Marital status: Divorced    Spouse name: Not on file  . Number of children: Not on file  . Years of education: Not on file  . Highest education level: Not on file  Occupational History  . Not on file  Social Needs  . Financial resource strain: Not on file  . Food insecurity:    Worry: Not on file    Inability: Not on file  . Transportation needs:    Medical: Not on file    Non-medical: Not on file  Tobacco Use  . Smoking status: Never Smoker  . Smokeless tobacco: Never Used   Substance and Sexual Activity  . Alcohol use: Yes    Comment: rarely  . Drug use: No  . Sexual activity: Not on file  Lifestyle  . Physical activity:    Days per week: Not on file    Minutes per session: Not on file  . Stress: Not on file  Relationships  . Social connections:    Talks on phone: Not on file    Gets together: Not on file    Attends religious service: Not on file    Active member of club or organization: Not on file    Attends meetings of clubs or organizations: Not on file    Relationship status: Not on file  Other Topics Concern  . Not on file  Social History Narrative   Exercising regularly     Observations/Objective: Appears well in NAD   Assessment and Plan:  See Problem List for Assessment and Plan of chronic medical problems.   Follow Up Instructions:    I discussed the assessment and treatment plan with the patient. The patient was provided an opportunity to ask questions and all were answered. The patient agreed with the plan and demonstrated an understanding of the instructions.   The patient was advised to call back or seek  an in-person evaluation if the symptoms worsen or if the condition fails to improve as anticipated.    Heather Rail, MD

## 2018-05-30 ENCOUNTER — Encounter: Payer: Self-pay | Admitting: Internal Medicine

## 2018-05-30 ENCOUNTER — Ambulatory Visit (INDEPENDENT_AMBULATORY_CARE_PROVIDER_SITE_OTHER): Payer: BLUE CROSS/BLUE SHIELD | Admitting: Internal Medicine

## 2018-05-30 DIAGNOSIS — K219 Gastro-esophageal reflux disease without esophagitis: Secondary | ICD-10-CM | POA: Diagnosis not present

## 2018-05-30 MED ORDER — FAMOTIDINE 20 MG PO TABS: 20.0000 mg | ORAL_TABLET | Freq: Two times a day (BID) | ORAL | Status: AC

## 2018-05-30 NOTE — Assessment & Plan Note (Signed)
She does not have any GERD symptoms, but only a cough Omeprazole 20 mg a day is effective, but does not want to take long-term ideally Pepcid Complete has helped, but still having cough Advised Pepcid/famotidine 20 mg twice daily or 40 mg daily Advised revising diet to see if that helps-GERD diet If famotidine 40 mg daily not effective she will let me know and I will send in omeprazole 20 mg daily. Advised that she may be able to switch back and forth from the omeprazole and the famotidine depending on her symptoms Discussed that her only symptom is the cough and she needs to go by that  Advised her to call with any questions or concerns

## 2018-08-05 DIAGNOSIS — H0288A Meibomian gland dysfunction right eye, upper and lower eyelids: Secondary | ICD-10-CM | POA: Diagnosis not present

## 2018-08-05 DIAGNOSIS — H04123 Dry eye syndrome of bilateral lacrimal glands: Secondary | ICD-10-CM | POA: Diagnosis not present

## 2018-08-05 DIAGNOSIS — L718 Other rosacea: Secondary | ICD-10-CM | POA: Diagnosis not present

## 2018-08-05 DIAGNOSIS — H16143 Punctate keratitis, bilateral: Secondary | ICD-10-CM | POA: Diagnosis not present

## 2018-08-12 DIAGNOSIS — M9903 Segmental and somatic dysfunction of lumbar region: Secondary | ICD-10-CM | POA: Diagnosis not present

## 2018-08-12 DIAGNOSIS — M5136 Other intervertebral disc degeneration, lumbar region: Secondary | ICD-10-CM | POA: Diagnosis not present

## 2018-09-03 DIAGNOSIS — M7582 Other shoulder lesions, left shoulder: Secondary | ICD-10-CM | POA: Diagnosis not present

## 2018-09-10 DIAGNOSIS — Z880 Allergy status to penicillin: Secondary | ICD-10-CM | POA: Diagnosis not present

## 2018-09-10 DIAGNOSIS — S63267A Dislocation of metacarpophalangeal joint of left little finger, initial encounter: Secondary | ICD-10-CM | POA: Diagnosis not present

## 2018-09-10 DIAGNOSIS — S6292XA Unspecified fracture of left wrist and hand, initial encounter for closed fracture: Secondary | ICD-10-CM | POA: Diagnosis not present

## 2018-09-10 DIAGNOSIS — S62345A Nondisplaced fracture of base of fourth metacarpal bone, left hand, initial encounter for closed fracture: Secondary | ICD-10-CM | POA: Diagnosis not present

## 2018-09-10 DIAGNOSIS — Z791 Long term (current) use of non-steroidal anti-inflammatories (NSAID): Secondary | ICD-10-CM | POA: Diagnosis not present

## 2018-09-10 DIAGNOSIS — Z79899 Other long term (current) drug therapy: Secondary | ICD-10-CM | POA: Diagnosis not present

## 2018-09-10 DIAGNOSIS — R29898 Other symptoms and signs involving the musculoskeletal system: Secondary | ICD-10-CM | POA: Diagnosis not present

## 2018-09-10 DIAGNOSIS — Z881 Allergy status to other antibiotic agents status: Secondary | ICD-10-CM | POA: Diagnosis not present

## 2018-09-10 DIAGNOSIS — S63265A Dislocation of metacarpophalangeal joint of left ring finger, initial encounter: Secondary | ICD-10-CM | POA: Diagnosis not present

## 2018-09-10 DIAGNOSIS — S62617A Displaced fracture of proximal phalanx of left little finger, initial encounter for closed fracture: Secondary | ICD-10-CM | POA: Diagnosis not present

## 2018-09-10 DIAGNOSIS — K219 Gastro-esophageal reflux disease without esophagitis: Secondary | ICD-10-CM | POA: Diagnosis not present

## 2018-09-10 DIAGNOSIS — S62615A Displaced fracture of proximal phalanx of left ring finger, initial encounter for closed fracture: Secondary | ICD-10-CM | POA: Diagnosis not present

## 2018-09-10 DIAGNOSIS — M25512 Pain in left shoulder: Secondary | ICD-10-CM | POA: Diagnosis not present

## 2018-09-12 DIAGNOSIS — S62615A Displaced fracture of proximal phalanx of left ring finger, initial encounter for closed fracture: Secondary | ICD-10-CM | POA: Diagnosis not present

## 2018-09-12 DIAGNOSIS — S62617A Displaced fracture of proximal phalanx of left little finger, initial encounter for closed fracture: Secondary | ICD-10-CM | POA: Diagnosis not present

## 2018-09-13 DIAGNOSIS — S62615A Displaced fracture of proximal phalanx of left ring finger, initial encounter for closed fracture: Secondary | ICD-10-CM | POA: Diagnosis not present

## 2018-09-13 DIAGNOSIS — S62617A Displaced fracture of proximal phalanx of left little finger, initial encounter for closed fracture: Secondary | ICD-10-CM | POA: Diagnosis not present

## 2018-09-19 DIAGNOSIS — S62615A Displaced fracture of proximal phalanx of left ring finger, initial encounter for closed fracture: Secondary | ICD-10-CM | POA: Diagnosis not present

## 2018-09-19 DIAGNOSIS — S62617A Displaced fracture of proximal phalanx of left little finger, initial encounter for closed fracture: Secondary | ICD-10-CM | POA: Diagnosis not present

## 2018-09-19 DIAGNOSIS — S62615D Displaced fracture of proximal phalanx of left ring finger, subsequent encounter for fracture with routine healing: Secondary | ICD-10-CM | POA: Diagnosis not present

## 2018-09-19 DIAGNOSIS — M79642 Pain in left hand: Secondary | ICD-10-CM | POA: Diagnosis not present

## 2018-09-19 DIAGNOSIS — S62617D Displaced fracture of proximal phalanx of left little finger, subsequent encounter for fracture with routine healing: Secondary | ICD-10-CM | POA: Diagnosis not present

## 2018-10-01 DIAGNOSIS — M9903 Segmental and somatic dysfunction of lumbar region: Secondary | ICD-10-CM | POA: Diagnosis not present

## 2018-10-01 DIAGNOSIS — M5136 Other intervertebral disc degeneration, lumbar region: Secondary | ICD-10-CM | POA: Diagnosis not present

## 2018-10-02 DIAGNOSIS — G8918 Other acute postprocedural pain: Secondary | ICD-10-CM | POA: Diagnosis not present

## 2018-10-02 DIAGNOSIS — M7542 Impingement syndrome of left shoulder: Secondary | ICD-10-CM | POA: Diagnosis not present

## 2018-10-02 DIAGNOSIS — M24112 Other articular cartilage disorders, left shoulder: Secondary | ICD-10-CM | POA: Diagnosis not present

## 2018-10-02 DIAGNOSIS — M75112 Incomplete rotator cuff tear or rupture of left shoulder, not specified as traumatic: Secondary | ICD-10-CM | POA: Diagnosis not present

## 2018-10-02 DIAGNOSIS — M7552 Bursitis of left shoulder: Secondary | ICD-10-CM | POA: Diagnosis not present

## 2018-10-03 DIAGNOSIS — S62615A Displaced fracture of proximal phalanx of left ring finger, initial encounter for closed fracture: Secondary | ICD-10-CM | POA: Diagnosis not present

## 2018-10-03 DIAGNOSIS — S62617A Displaced fracture of proximal phalanx of left little finger, initial encounter for closed fracture: Secondary | ICD-10-CM | POA: Diagnosis not present

## 2018-10-07 DIAGNOSIS — M24112 Other articular cartilage disorders, left shoulder: Secondary | ICD-10-CM | POA: Diagnosis not present

## 2018-10-07 DIAGNOSIS — M25512 Pain in left shoulder: Secondary | ICD-10-CM | POA: Diagnosis not present

## 2018-10-07 DIAGNOSIS — M6281 Muscle weakness (generalized): Secondary | ICD-10-CM | POA: Diagnosis not present

## 2018-10-07 DIAGNOSIS — M25612 Stiffness of left shoulder, not elsewhere classified: Secondary | ICD-10-CM | POA: Diagnosis not present

## 2018-10-09 DIAGNOSIS — M6281 Muscle weakness (generalized): Secondary | ICD-10-CM | POA: Diagnosis not present

## 2018-10-09 DIAGNOSIS — M25612 Stiffness of left shoulder, not elsewhere classified: Secondary | ICD-10-CM | POA: Diagnosis not present

## 2018-10-09 DIAGNOSIS — M25512 Pain in left shoulder: Secondary | ICD-10-CM | POA: Diagnosis not present

## 2018-10-17 DIAGNOSIS — M6281 Muscle weakness (generalized): Secondary | ICD-10-CM | POA: Diagnosis not present

## 2018-10-17 DIAGNOSIS — M25512 Pain in left shoulder: Secondary | ICD-10-CM | POA: Diagnosis not present

## 2018-10-17 DIAGNOSIS — M25612 Stiffness of left shoulder, not elsewhere classified: Secondary | ICD-10-CM | POA: Diagnosis not present

## 2018-10-23 DIAGNOSIS — M9903 Segmental and somatic dysfunction of lumbar region: Secondary | ICD-10-CM | POA: Diagnosis not present

## 2018-10-23 DIAGNOSIS — M5136 Other intervertebral disc degeneration, lumbar region: Secondary | ICD-10-CM | POA: Diagnosis not present

## 2018-10-31 DIAGNOSIS — M25512 Pain in left shoulder: Secondary | ICD-10-CM | POA: Diagnosis not present

## 2018-10-31 DIAGNOSIS — M25612 Stiffness of left shoulder, not elsewhere classified: Secondary | ICD-10-CM | POA: Diagnosis not present

## 2018-10-31 DIAGNOSIS — M6281 Muscle weakness (generalized): Secondary | ICD-10-CM | POA: Diagnosis not present

## 2018-11-04 DIAGNOSIS — M25512 Pain in left shoulder: Secondary | ICD-10-CM | POA: Diagnosis not present

## 2018-11-05 DIAGNOSIS — M6281 Muscle weakness (generalized): Secondary | ICD-10-CM | POA: Diagnosis not present

## 2018-11-05 DIAGNOSIS — S62615A Displaced fracture of proximal phalanx of left ring finger, initial encounter for closed fracture: Secondary | ICD-10-CM | POA: Diagnosis not present

## 2018-11-05 DIAGNOSIS — S62617A Displaced fracture of proximal phalanx of left little finger, initial encounter for closed fracture: Secondary | ICD-10-CM | POA: Diagnosis not present

## 2018-11-05 DIAGNOSIS — M25512 Pain in left shoulder: Secondary | ICD-10-CM | POA: Diagnosis not present

## 2018-11-05 DIAGNOSIS — M25612 Stiffness of left shoulder, not elsewhere classified: Secondary | ICD-10-CM | POA: Diagnosis not present

## 2018-11-11 DIAGNOSIS — M25612 Stiffness of left shoulder, not elsewhere classified: Secondary | ICD-10-CM | POA: Diagnosis not present

## 2018-11-11 DIAGNOSIS — M25512 Pain in left shoulder: Secondary | ICD-10-CM | POA: Diagnosis not present

## 2018-11-11 DIAGNOSIS — M6281 Muscle weakness (generalized): Secondary | ICD-10-CM | POA: Diagnosis not present

## 2018-11-18 DIAGNOSIS — M25512 Pain in left shoulder: Secondary | ICD-10-CM | POA: Diagnosis not present

## 2018-11-18 DIAGNOSIS — M6281 Muscle weakness (generalized): Secondary | ICD-10-CM | POA: Diagnosis not present

## 2018-11-18 DIAGNOSIS — M25612 Stiffness of left shoulder, not elsewhere classified: Secondary | ICD-10-CM | POA: Diagnosis not present

## 2018-11-25 DIAGNOSIS — M25512 Pain in left shoulder: Secondary | ICD-10-CM | POA: Diagnosis not present

## 2018-11-25 DIAGNOSIS — M25612 Stiffness of left shoulder, not elsewhere classified: Secondary | ICD-10-CM | POA: Diagnosis not present

## 2018-11-25 DIAGNOSIS — M6281 Muscle weakness (generalized): Secondary | ICD-10-CM | POA: Diagnosis not present

## 2018-12-02 DIAGNOSIS — M25512 Pain in left shoulder: Secondary | ICD-10-CM | POA: Diagnosis not present

## 2018-12-05 DIAGNOSIS — M25512 Pain in left shoulder: Secondary | ICD-10-CM | POA: Diagnosis not present

## 2018-12-05 DIAGNOSIS — M6281 Muscle weakness (generalized): Secondary | ICD-10-CM | POA: Diagnosis not present

## 2018-12-05 DIAGNOSIS — M25612 Stiffness of left shoulder, not elsewhere classified: Secondary | ICD-10-CM | POA: Diagnosis not present

## 2018-12-19 DIAGNOSIS — M25612 Stiffness of left shoulder, not elsewhere classified: Secondary | ICD-10-CM | POA: Diagnosis not present

## 2018-12-19 DIAGNOSIS — M25512 Pain in left shoulder: Secondary | ICD-10-CM | POA: Diagnosis not present

## 2018-12-19 DIAGNOSIS — M6281 Muscle weakness (generalized): Secondary | ICD-10-CM | POA: Diagnosis not present

## 2018-12-25 DIAGNOSIS — H029 Unspecified disorder of eyelid: Secondary | ICD-10-CM | POA: Diagnosis not present

## 2018-12-30 DIAGNOSIS — M25512 Pain in left shoulder: Secondary | ICD-10-CM | POA: Diagnosis not present

## 2019-01-13 DIAGNOSIS — M9903 Segmental and somatic dysfunction of lumbar region: Secondary | ICD-10-CM | POA: Diagnosis not present

## 2019-01-13 DIAGNOSIS — M5136 Other intervertebral disc degeneration, lumbar region: Secondary | ICD-10-CM | POA: Diagnosis not present

## 2019-01-13 DIAGNOSIS — H16143 Punctate keratitis, bilateral: Secondary | ICD-10-CM | POA: Diagnosis not present

## 2019-01-13 DIAGNOSIS — H04123 Dry eye syndrome of bilateral lacrimal glands: Secondary | ICD-10-CM | POA: Diagnosis not present

## 2019-01-13 DIAGNOSIS — H5213 Myopia, bilateral: Secondary | ICD-10-CM | POA: Diagnosis not present

## 2019-01-13 DIAGNOSIS — H0288A Meibomian gland dysfunction right eye, upper and lower eyelids: Secondary | ICD-10-CM | POA: Diagnosis not present

## 2019-01-22 DIAGNOSIS — M5136 Other intervertebral disc degeneration, lumbar region: Secondary | ICD-10-CM | POA: Diagnosis not present

## 2019-01-22 DIAGNOSIS — M9903 Segmental and somatic dysfunction of lumbar region: Secondary | ICD-10-CM | POA: Diagnosis not present

## 2019-01-30 DIAGNOSIS — S73191A Other sprain of right hip, initial encounter: Secondary | ICD-10-CM | POA: Diagnosis not present

## 2019-02-03 DIAGNOSIS — H16143 Punctate keratitis, bilateral: Secondary | ICD-10-CM | POA: Diagnosis not present

## 2019-02-03 DIAGNOSIS — L718 Other rosacea: Secondary | ICD-10-CM | POA: Diagnosis not present

## 2019-02-03 DIAGNOSIS — H04123 Dry eye syndrome of bilateral lacrimal glands: Secondary | ICD-10-CM | POA: Diagnosis not present

## 2019-02-03 DIAGNOSIS — H0288A Meibomian gland dysfunction right eye, upper and lower eyelids: Secondary | ICD-10-CM | POA: Diagnosis not present

## 2019-02-04 DIAGNOSIS — M5136 Other intervertebral disc degeneration, lumbar region: Secondary | ICD-10-CM | POA: Diagnosis not present

## 2019-02-04 DIAGNOSIS — M9903 Segmental and somatic dysfunction of lumbar region: Secondary | ICD-10-CM | POA: Diagnosis not present

## 2019-02-12 DIAGNOSIS — M25551 Pain in right hip: Secondary | ICD-10-CM | POA: Diagnosis not present

## 2019-02-21 DIAGNOSIS — M25512 Pain in left shoulder: Secondary | ICD-10-CM | POA: Diagnosis not present

## 2019-02-21 DIAGNOSIS — M7522 Bicipital tendinitis, left shoulder: Secondary | ICD-10-CM | POA: Diagnosis not present

## 2019-02-28 DIAGNOSIS — M25512 Pain in left shoulder: Secondary | ICD-10-CM | POA: Diagnosis not present

## 2019-02-28 DIAGNOSIS — M7522 Bicipital tendinitis, left shoulder: Secondary | ICD-10-CM | POA: Diagnosis not present

## 2019-03-05 DIAGNOSIS — H04123 Dry eye syndrome of bilateral lacrimal glands: Secondary | ICD-10-CM | POA: Diagnosis not present

## 2019-03-05 DIAGNOSIS — H018 Other specified inflammations of eyelid: Secondary | ICD-10-CM | POA: Diagnosis not present

## 2019-03-07 DIAGNOSIS — M7522 Bicipital tendinitis, left shoulder: Secondary | ICD-10-CM | POA: Diagnosis not present

## 2019-03-07 DIAGNOSIS — M25512 Pain in left shoulder: Secondary | ICD-10-CM | POA: Diagnosis not present

## 2019-03-18 DIAGNOSIS — H0288B Meibomian gland dysfunction left eye, upper and lower eyelids: Secondary | ICD-10-CM | POA: Diagnosis not present

## 2019-03-18 DIAGNOSIS — H0288A Meibomian gland dysfunction right eye, upper and lower eyelids: Secondary | ICD-10-CM | POA: Diagnosis not present

## 2019-03-18 DIAGNOSIS — L718 Other rosacea: Secondary | ICD-10-CM | POA: Diagnosis not present

## 2019-03-18 DIAGNOSIS — H04123 Dry eye syndrome of bilateral lacrimal glands: Secondary | ICD-10-CM | POA: Diagnosis not present

## 2019-03-19 DIAGNOSIS — M7522 Bicipital tendinitis, left shoulder: Secondary | ICD-10-CM | POA: Diagnosis not present

## 2019-03-19 DIAGNOSIS — M25512 Pain in left shoulder: Secondary | ICD-10-CM | POA: Diagnosis not present

## 2019-04-04 DIAGNOSIS — M7522 Bicipital tendinitis, left shoulder: Secondary | ICD-10-CM | POA: Diagnosis not present

## 2019-04-04 DIAGNOSIS — M25512 Pain in left shoulder: Secondary | ICD-10-CM | POA: Diagnosis not present

## 2019-04-21 DIAGNOSIS — M25512 Pain in left shoulder: Secondary | ICD-10-CM | POA: Diagnosis not present

## 2019-04-21 DIAGNOSIS — M7522 Bicipital tendinitis, left shoulder: Secondary | ICD-10-CM | POA: Diagnosis not present

## 2019-06-17 DIAGNOSIS — H0288A Meibomian gland dysfunction right eye, upper and lower eyelids: Secondary | ICD-10-CM | POA: Diagnosis not present

## 2019-06-17 DIAGNOSIS — H04123 Dry eye syndrome of bilateral lacrimal glands: Secondary | ICD-10-CM | POA: Diagnosis not present

## 2019-06-17 DIAGNOSIS — H16143 Punctate keratitis, bilateral: Secondary | ICD-10-CM | POA: Diagnosis not present

## 2019-06-17 DIAGNOSIS — L718 Other rosacea: Secondary | ICD-10-CM | POA: Diagnosis not present

## 2019-07-17 DIAGNOSIS — M9903 Segmental and somatic dysfunction of lumbar region: Secondary | ICD-10-CM | POA: Diagnosis not present

## 2019-07-23 DIAGNOSIS — M25551 Pain in right hip: Secondary | ICD-10-CM | POA: Diagnosis not present

## 2019-07-28 DIAGNOSIS — H04123 Dry eye syndrome of bilateral lacrimal glands: Secondary | ICD-10-CM | POA: Diagnosis not present

## 2019-07-28 DIAGNOSIS — H0288A Meibomian gland dysfunction right eye, upper and lower eyelids: Secondary | ICD-10-CM | POA: Diagnosis not present

## 2019-07-28 DIAGNOSIS — H16143 Punctate keratitis, bilateral: Secondary | ICD-10-CM | POA: Diagnosis not present

## 2019-07-28 DIAGNOSIS — L718 Other rosacea: Secondary | ICD-10-CM | POA: Diagnosis not present

## 2019-08-11 DIAGNOSIS — M9903 Segmental and somatic dysfunction of lumbar region: Secondary | ICD-10-CM | POA: Diagnosis not present

## 2019-09-15 DIAGNOSIS — M9903 Segmental and somatic dysfunction of lumbar region: Secondary | ICD-10-CM | POA: Diagnosis not present

## 2019-09-18 NOTE — Progress Notes (Signed)
Subjective:    Patient ID: Heather Huynh, female    DOB: [birth year], 64 y.o.   MRN: [id number]  HPI The patient is here for an acute visit.   lightheadedness, off balance:    It started about 10 days ago and it has occurred 4 times in that time.  Twice it occurred when she got up from a prone position.  She feels like a head rush that is transient.  It is more of a lightheadedness than true dizziness or vertigo.  She does not recall to the times that it occurred if it was the same situation.  She does have postural lightheadedness when she stands quickly and this is different.  She denies any changes in medications, supplements   She drinks plenty of water.     Medications and allergies reviewed with patient and updated if appropriate.  Patient Active Problem List   Diagnosis Date Noted  . Patellofemoral disorder of right knee 07/07/2016  . Right Achilles tendinitis 07/07/2016  . Ocular rosacea 11/22/2015  . Pyriformis syndrome, left 01/19/2015  . GERD (gastroesophageal reflux disease) 01/19/2015  . Hypoglycemia 01/19/2015  . 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 blood glucose meter kit and supplies KIT Dispense based on patient and insurance preference. Use up to four times daily as directed. For hypoglycemia 1 each 0  . doxycycline (VIBRAMYCIN) 50 MG capsule Take 50 mg by mouth every other day.    . famotidine (PEPCID) 20 MG tablet Take 1 tablet (20 mg total) by mouth 2 (two) times daily.    . Ferrous Sulfate (IRON) 325 (65 FE) MG TABS Take 1 tablet by mouth daily.     No current facility-administered medications on file prior to visit.    Past Medical History:  Diagnosis Date  . Achilles bursitis or tendinitis 03/05/2008   Qualifier: Diagnosis of  By: Laurance Flatten CMA, [place]    . Anemia   . GERD (gastroesophageal reflux disease)     Past Surgical History:  Procedure  Laterality Date  . ACL    . CESAREAN SECTION    . SHOULDER ARTHROSCOPY Right   . TENDON REPAIR      Social History   Socioeconomic History  . Marital status: Divorced    Spouse name: Not on file  . Number of children: Not on file  . Years of education: Not on file  . Highest education level: Not on file  Occupational History  . Not on file  Tobacco Use  . Smoking status: Never Smoker  . Smokeless tobacco: Never Used  Substance and Sexual Activity  . Alcohol use: Yes    Comment: rarely  . Drug use: No  . Sexual activity: Not on file  Other Topics Concern  . Not on file  Social History Narrative   Exercising regularly   Social Determinants of Health   Financial Resource Strain:   . Difficulty of Paying Living Expenses:   Food Insecurity:   . Worried About Charity fundraiser in the Last Year:   . Arboriculturist in the Last Year:   Transportation Needs:   . Film/video editor (Medical):   Marland Kitchen Lack of Transportation (Non-Medical):   Physical Activity:   . Days of Exercise per Week:   . Minutes of Exercise per Session:   Stress:   . Feeling of Stress :   Social  Connections:   . Frequency of Communication with Friends and Family:   . Frequency of Social Gatherings with Friends and Family:   . Attends Religious Services:   . Active Member of Clubs or Organizations:   . Attends Archivist Meetings:   Marland Kitchen Marital Status:     Family History  Problem Relation Age of Onset  . Breast cancer Mother   . Breast cancer Maternal Grandmother   . Heart disease Maternal Grandmother   . Heart disease Maternal Grandfather   . Sudden death Neg Hx   . Hypertension Neg Hx   . Hyperlipidemia Neg Hx   . Heart attack Neg Hx   . Diabetes Neg Hx   . Colon cancer Neg Hx   . Esophageal cancer Neg Hx   . Stomach cancer Neg Hx   . Rectal cancer Neg Hx     Review of Systems  Constitutional: Negative for chills and fever.  HENT: Positive for postnasal drip. Negative for  congestion, ear pain, sinus pain, sore throat and tinnitus.   Eyes: Negative for visual disturbance.  Respiratory: Negative for cough, shortness of breath and wheezing.   Cardiovascular: Negative for chest pain and palpitations.  Gastrointestinal: Negative for nausea.  Neurological: Positive for light-headedness. Negative for dizziness, weakness, numbness and headaches.       Objective:   Vitals:   09/19/19 1333  BP: 130/80  Pulse: 70  Temp: 97.9 F (36.6 C)  SpO2: 98%   BP Readings from Last 3 Encounters:  09/19/19 130/80  05/18/17 94/66  07/07/16 (!) 100/58   Wt Readings from Last 3 Encounters:  09/19/19 136 lb (61.7 kg)  05/18/17 145 lb (65.8 kg)  07/07/16 133 lb (60.3 kg)   Body mass index is 24.87 kg/m.   Physical Exam Constitutional:      General: She is not in acute distress.    Appearance: Normal appearance. She is not ill-appearing.  HENT:     Head: Normocephalic and atraumatic.     Right Ear: Tympanic membrane, ear canal and external ear normal. There is no impacted cerumen.     Left Ear: Tympanic membrane, ear canal and external ear normal. There is no impacted cerumen.     Mouth/Throat:     Mouth: Mucous membranes are moist.     Pharynx: No posterior oropharyngeal erythema.  Eyes:     General: No scleral icterus.    Extraocular Movements: Extraocular movements intact.     Conjunctiva/sclera: Conjunctivae normal.  Neck:     Vascular: No carotid bruit.  Cardiovascular:     Rate and Rhythm: Normal rate and regular rhythm.     Heart sounds: No murmur heard.   Pulmonary:     Effort: Pulmonary effort is normal.     Breath sounds: Normal breath sounds.  Musculoskeletal:     Cervical back: No rigidity or tenderness.     Right lower leg: No edema.     Left lower leg: No edema.  Lymphadenopathy:     Cervical: No cervical adenopathy.  Skin:    General: Skin is warm and dry.  Neurological:     General: No focal deficit present.     Mental Status: She  is alert.  Psychiatric:        Mood and Affect: Mood normal.            Assessment & Plan:    See Problem List for Assessment and Plan of chronic medical problems.    This visit  occurred during the SARS-CoV-2 public health emergency.  Safety protocols were in place, including screening questions prior to the visit, additional usage of staff PPE, and extensive cleaning of exam room while observing appropriate contact time as indicated for disinfecting solutions.

## 2019-09-19 ENCOUNTER — Encounter: Payer: Self-pay | Admitting: Internal Medicine

## 2019-09-19 ENCOUNTER — Other Ambulatory Visit: Payer: Self-pay

## 2019-09-19 ENCOUNTER — Ambulatory Visit: Payer: BC Managed Care – PPO | Admitting: Internal Medicine

## 2019-09-19 DIAGNOSIS — R42 Dizziness and giddiness: Secondary | ICD-10-CM | POA: Diagnosis not present

## 2019-09-19 NOTE — Assessment & Plan Note (Signed)
Acute In the past 10 days she has had 4 episodes of transient lightheadedness/hot rash-not true dizziness/vertigo and different from her postural lightheadedness due to low BP Episodes of very transient and do tend to occur when going from a prone position to sitting up No concerning symptoms in history or findings on exam Discussed possible causes At this time we both decided to not evaluate further and just to monitor She will let me know if the symptoms persist and at that time we can pursue further evaluation

## 2019-09-19 NOTE — Patient Instructions (Signed)
Your symptoms are likely atypical vertigo.     Please call if there is no improvement in your symptoms.

## 2019-09-29 DIAGNOSIS — M9903 Segmental and somatic dysfunction of lumbar region: Secondary | ICD-10-CM | POA: Diagnosis not present

## 2019-10-30 DIAGNOSIS — M9903 Segmental and somatic dysfunction of lumbar region: Secondary | ICD-10-CM | POA: Diagnosis not present

## 2019-11-05 DIAGNOSIS — M25551 Pain in right hip: Secondary | ICD-10-CM | POA: Diagnosis not present

## 2019-11-24 ENCOUNTER — Telehealth: Payer: Self-pay | Admitting: Internal Medicine

## 2019-11-24 NOTE — Telephone Encounter (Signed)
Needs appt

## 2019-11-24 NOTE — Telephone Encounter (Signed)
OV or did you want to send her in something?

## 2019-11-24 NOTE — Telephone Encounter (Signed)
Patient calling with a summer/fall cold,  Cough, somewhat productive, pressure build-up on nose, teeth, front of face  Patient has tried a cough expressant, Cough suppressant, anti-histamine  Ok to leave a message on her phone [phone]

## 2019-11-25 NOTE — Telephone Encounter (Signed)
Left message for patient to schedule virtual visit with Dr. Quay Burow

## 2019-11-26 DIAGNOSIS — M9903 Segmental and somatic dysfunction of lumbar region: Secondary | ICD-10-CM | POA: Diagnosis not present

## 2019-12-18 ENCOUNTER — Other Ambulatory Visit: Payer: Self-pay | Admitting: Internal Medicine

## 2019-12-18 DIAGNOSIS — M9903 Segmental and somatic dysfunction of lumbar region: Secondary | ICD-10-CM | POA: Diagnosis not present

## 2019-12-18 DIAGNOSIS — Z1231 Encounter for screening mammogram for malignant neoplasm of breast: Secondary | ICD-10-CM

## 2020-01-16 ENCOUNTER — Other Ambulatory Visit: Payer: Self-pay

## 2020-01-16 ENCOUNTER — Telehealth (INDEPENDENT_AMBULATORY_CARE_PROVIDER_SITE_OTHER): Payer: BC Managed Care – PPO | Admitting: Internal Medicine

## 2020-01-16 ENCOUNTER — Encounter: Payer: Self-pay | Admitting: Internal Medicine

## 2020-01-16 ENCOUNTER — Telehealth: Payer: Self-pay | Admitting: Internal Medicine

## 2020-01-16 DIAGNOSIS — J019 Acute sinusitis, unspecified: Secondary | ICD-10-CM | POA: Diagnosis not present

## 2020-01-16 MED ORDER — HYDROCODONE-HOMATROPINE 5-1.5 MG/5ML PO SYRP: 5.0000 mL | ORAL_SOLUTION | Freq: Three times a day (TID) | ORAL | 0 refills | Status: DC | PRN

## 2020-01-16 MED ORDER — DOXYCYCLINE HYCLATE 100 MG PO TABS: 100.0000 mg | ORAL_TABLET | Freq: Two times a day (BID) | ORAL | 0 refills | Status: DC

## 2020-01-16 NOTE — Assessment & Plan Note (Signed)
Acute Likely bacterial  Start doxycyline 100 mg BID x 10 days ( hold low dose doxy until this course is complete) otc cold medications Rest, fluid Call if no improvement

## 2020-01-16 NOTE — Progress Notes (Addendum)
Virtual Visit via telephone Note  I connected with Heather Huynh on 01/16/20 at  2:15 PM EST by telephone and verified that I am speaking with the correct person using two identifiers.   I discussed the limitations of evaluation and management by telemedicine and the availability of in person appointments. The patient expressed understanding and agreed to proceed.  Present for the visit:  Myself, Dr Billey Gosling, Carola Rhine.  The patient is currently at home and I am in the office.    No referring provider.    History of Present Illness: This is an acute visit for cold symptoms   Her symptoms started 5 days ago.  She has a dry cough, initially teeth pain and now just sinus pain, congestion, cracking in her ears and think sinus drainage.  If she is able to cough up any mucus it is green in color.  She denies fever.    Tried sudafed.  Anti-histamine  Review of Systems  Constitutional: Negative for fever.  HENT: Positive for sore throat. Negative for ear pain (crackling inner ear).        Teeth pain initially - resolved..  Sinus drainage  Respiratory: Positive for cough. Negative for sputum production, shortness of breath and wheezing.   Neurological: Negative for dizziness and headaches.       Social History   Socioeconomic History  . Marital status: Divorced    Spouse name: Not on file  . Number of children: Not on file  . Years of education: Not on file  . Highest education level: Not on file  Occupational History  . Not on file  Tobacco Use  . Smoking status: Never Smoker  . Smokeless tobacco: Never Used  Substance and Sexual Activity  . Alcohol use: Yes    Comment: rarely  . Drug use: No  . Sexual activity: Not on file  Other Topics Concern  . Not on file  Social History Narrative   Exercising regularly   Social Determinants of Health   Financial Resource Strain:   . Difficulty of Paying Living Expenses: Not on file  Food Insecurity:   . Worried About  Charity fundraiser in the Last Year: Not on file  . Ran Out of Food in the Last Year: Not on file  Transportation Needs:   . Lack of Transportation (Medical): Not on file  . Lack of Transportation (Non-Medical): Not on file  Physical Activity:   . Days of Exercise per Week: Not on file  . Minutes of Exercise per Session: Not on file  Stress:   . Feeling of Stress : Not on file  Social Connections:   . Frequency of Communication with Friends and Family: Not on file  . Frequency of Social Gatherings with Friends and Family: Not on file  . Attends Religious Services: Not on file  . Active Member of Clubs or Organizations: Not on file  . Attends Archivist Meetings: Not on file  . Marital Status: Not on file     Observations/Objective:    Assessment and Plan:  See Problem List for Assessment and Plan of chronic medical problems.   Follow Up Instructions:    I discussed the assessment and treatment plan with the patient. The patient was provided an opportunity to ask questions and all were answered. The patient agreed with the plan and demonstrated an understanding of the instructions.   The patient was advised to call back or seek an in-person evaluation if the  symptoms worsen or if the condition fails to improve as anticipated.  Time spent on telephone visit - 8 mintues  Binnie Rail, MD

## 2020-01-16 NOTE — Telephone Encounter (Signed)
sent 

## 2020-01-16 NOTE — Telephone Encounter (Signed)
HYDROcodone-homatropine (HYCODAN) 5-1.5 MG/5ML syrup  [facility] but [facility] [address], [place] [address] Has the medication.

## 2020-01-16 NOTE — Telephone Encounter (Signed)
HYDROcodone-homatropine (HYCODAN) 5-1.5 MG/5ML syrup [facility] so she is wondering if we can send it to [place], [place] - [address] Phone:  [phone]  Fax:  [phone]

## 2020-01-19 DIAGNOSIS — M25551 Pain in right hip: Secondary | ICD-10-CM | POA: Diagnosis not present

## 2020-01-19 DIAGNOSIS — M25561 Pain in right knee: Secondary | ICD-10-CM | POA: Diagnosis not present

## 2020-02-02 DIAGNOSIS — M1711 Unilateral primary osteoarthritis, right knee: Secondary | ICD-10-CM | POA: Diagnosis not present

## 2020-02-10 DIAGNOSIS — M1711 Unilateral primary osteoarthritis, right knee: Secondary | ICD-10-CM | POA: Diagnosis not present

## 2020-02-16 DIAGNOSIS — M1711 Unilateral primary osteoarthritis, right knee: Secondary | ICD-10-CM | POA: Diagnosis not present

## 2020-02-18 DIAGNOSIS — M25551 Pain in right hip: Secondary | ICD-10-CM | POA: Diagnosis not present

## 2020-05-21 ENCOUNTER — Other Ambulatory Visit: Payer: Self-pay

## 2020-05-21 ENCOUNTER — Ambulatory Visit: Payer: BC Managed Care – PPO | Admitting: Internal Medicine

## 2020-05-21 VITALS — BP 116/78 | HR 72 | Temp 98.1°F | Ht 62.0 in | Wt 130.2 lb

## 2020-05-21 DIAGNOSIS — D171 Benign lipomatous neoplasm of skin and subcutaneous tissue of trunk: Secondary | ICD-10-CM | POA: Diagnosis not present

## 2020-05-21 DIAGNOSIS — Z1283 Encounter for screening for malignant neoplasm of skin: Secondary | ICD-10-CM

## 2020-05-21 NOTE — Progress Notes (Signed)
Subjective:    Patient ID: Heather Huynh, female    DOB: 09-Nov-1955, 65 y.o.   MRN: [id number]  HPI The patient is here for an acute visit.  6 weeks agp her husband noticed a lump in her right mid  Back.  She thinks if may have gotten larger.  It does not hurt.  She denies any trauma to the area.      Medications and allergies reviewed with patient and updated if appropriate.  Patient Active Problem List   Diagnosis Date Noted  . Acute sinus infection 01/16/2020  . Episodic lightheadedness 09/19/2019  . Patellofemoral disorder of right knee 07/07/2016  . Right Achilles tendinitis 07/07/2016  . Ocular rosacea 11/22/2015  . Pyriformis syndrome, left 01/19/2015  . GERD (gastroesophageal reflux disease) 01/19/2015  . Hypoglycemia 01/19/2015  . 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 blood glucose meter kit and supplies KIT Dispense based on patient and insurance preference. Use up to four times daily as directed. For hypoglycemia 1 each 0  . doxycycline (VIBRAMYCIN) 50 MG capsule Take 50 mg by mouth every other day.    Marland Kitchen Epinastine HCl 0.05 % ophthalmic solution     . famotidine (PEPCID) 20 MG tablet Take 1 tablet (20 mg total) by mouth 2 (two) times daily.    . Ferrous Sulfate (IRON) 325 (65 FE) MG TABS Take 1 tablet by mouth daily.    Marland Kitchen HYDROcodone-homatropine (HYCODAN) 5-1.5 MG/5ML syrup Take 5 mLs by mouth every 8 (eight) hours as needed for cough. 120 mL 0  . XIIDRA 5 % SOLN Apply 1 drop to eye 2 (two) times daily.     No current facility-administered medications on file prior to visit.    Past Medical History:  Diagnosis Date  . Achilles bursitis or tendinitis 03/05/2008   Qualifier: Diagnosis of  By: Laurance Flatten CMA, [place]    . Anemia   . GERD (gastroesophageal reflux disease)     Past Surgical History:  Procedure Laterality Date  . ACL    . CESAREAN SECTION    .  SHOULDER ARTHROSCOPY Right   . TENDON REPAIR      Social History   Socioeconomic History  . Marital status: Divorced    Spouse name: Not on file  . Number of children: Not on file  . Years of education: Not on file  . Highest education level: Not on file  Occupational History  . Not on file  Tobacco Use  . Smoking status: Never Smoker  . Smokeless tobacco: Never Used  Substance and Sexual Activity  . Alcohol use: Yes    Comment: rarely  . Drug use: No  . Sexual activity: Not on file  Other Topics Concern  . Not on file  Social History Narrative   Exercising regularly   Social Determinants of Health   Financial Resource Strain: Not on file  Food Insecurity: Not on file  Transportation Needs: Not on file  Physical Activity: Not on file  Stress: Not on file  Social Connections: Not on file    Family History  Problem Relation Age of Onset  . Breast cancer Mother   . Breast cancer Maternal Grandmother   . Heart disease Maternal Grandmother   . Heart disease Maternal Grandfather   . Sudden death Neg Hx   . Hypertension Neg Hx   . Hyperlipidemia Neg Hx   .  Heart attack Neg Hx   . Diabetes Neg Hx   . Colon cancer Neg Hx   . Esophageal cancer Neg Hx   . Stomach cancer Neg Hx   . Rectal cancer Neg Hx     Review of Systems  Constitutional: Negative for chills and fever.  Skin: Negative for color change and wound.       Bump under skin  Neurological: Negative for numbness.       Objective:   Vitals:   05/21/20 1537  BP: 116/78  Pulse: 72  Temp: 98.1 F (36.7 C)  SpO2: 99%   BP Readings from Last 3 Encounters:  05/21/20 116/78  09/19/19 130/80  05/18/17 94/66   Wt Readings from Last 3 Encounters:  05/21/20 130 lb 3.2 oz (59.1 kg)  09/19/19 136 lb (61.7 kg)  05/18/17 145 lb (65.8 kg)   Body mass index is 23.81 kg/m.   Physical Exam Constitutional:      General: She is not in acute distress.    Appearance: Normal appearance. She is not  ill-appearing.  HENT:     Head: Normocephalic and atraumatic.  Skin:    General: Skin is dry.     Findings: No erythema.     Comments: Ping pong ball sized soft lump in r mid back. Non - tender, mobile, soft  She has a couple of moles that should be evaluated by derm  Neurological:     Mental Status: She is alert.            Assessment & Plan:    See Problem List for Assessment and Plan of chronic medical problems.    This visit occurred during the SARS-CoV-2 public health emergency.  Safety protocols were in place, including screening questions prior to the visit, additional usage of staff PPE, and extensive cleaning of exam room while observing appropriate contact time as indicated for disinfecting solutions.

## 2020-05-21 NOTE — Patient Instructions (Signed)
  A referral was ordered for [facility].      Someone from their office will call you to schedule an appointment.

## 2020-05-23 ENCOUNTER — Encounter: Payer: Self-pay | Admitting: Internal Medicine

## 2020-05-23 DIAGNOSIS — D171 Benign lipomatous neoplasm of skin and subcutaneous tissue of trunk: Secondary | ICD-10-CM | POA: Insufficient documentation

## 2020-05-23 DIAGNOSIS — Z1283 Encounter for screening for malignant neoplasm of skin: Secondary | ICD-10-CM | POA: Insufficient documentation

## 2020-05-23 NOTE — Assessment & Plan Note (Signed)
She has a couple of moles that should be evaluated by derm - on back and abdomen referred

## 2020-05-23 NOTE — Assessment & Plan Note (Signed)
Acute  Lump in r mid back -- it is mobile, nontender, soft and likely a lipoma reassured this was benign She would like it removed Referred to derm

## 2020-06-04 DIAGNOSIS — D1801 Hemangioma of skin and subcutaneous tissue: Secondary | ICD-10-CM | POA: Diagnosis not present

## 2020-06-04 DIAGNOSIS — D224 Melanocytic nevi of scalp and neck: Secondary | ICD-10-CM | POA: Diagnosis not present

## 2020-06-04 DIAGNOSIS — L814 Other melanin hyperpigmentation: Secondary | ICD-10-CM | POA: Diagnosis not present

## 2020-06-04 DIAGNOSIS — L821 Other seborrheic keratosis: Secondary | ICD-10-CM | POA: Diagnosis not present

## 2020-06-04 DIAGNOSIS — D485 Neoplasm of uncertain behavior of skin: Secondary | ICD-10-CM | POA: Diagnosis not present

## 2020-06-04 DIAGNOSIS — D2221 Melanocytic nevi of right ear and external auricular canal: Secondary | ICD-10-CM | POA: Diagnosis not present

## 2020-06-04 DIAGNOSIS — D225 Melanocytic nevi of trunk: Secondary | ICD-10-CM | POA: Diagnosis not present

## 2020-06-07 DIAGNOSIS — H16143 Punctate keratitis, bilateral: Secondary | ICD-10-CM | POA: Diagnosis not present

## 2020-06-07 DIAGNOSIS — H25813 Combined forms of age-related cataract, bilateral: Secondary | ICD-10-CM | POA: Diagnosis not present

## 2020-06-07 DIAGNOSIS — H0288B Meibomian gland dysfunction left eye, upper and lower eyelids: Secondary | ICD-10-CM | POA: Diagnosis not present

## 2020-06-07 DIAGNOSIS — H0288A Meibomian gland dysfunction right eye, upper and lower eyelids: Secondary | ICD-10-CM | POA: Diagnosis not present

## 2020-06-07 DIAGNOSIS — H04123 Dry eye syndrome of bilateral lacrimal glands: Secondary | ICD-10-CM | POA: Diagnosis not present

## 2020-06-18 DIAGNOSIS — M25551 Pain in right hip: Secondary | ICD-10-CM | POA: Diagnosis not present

## 2020-07-19 ENCOUNTER — Telehealth: Payer: Self-pay | Admitting: Internal Medicine

## 2020-09-06 ENCOUNTER — Ambulatory Visit: Payer: Self-pay | Admitting: Surgery

## 2020-09-06 DIAGNOSIS — D171 Benign lipomatous neoplasm of skin and subcutaneous tissue of trunk: Secondary | ICD-10-CM | POA: Diagnosis not present

## 2020-09-06 NOTE — H&P (Signed)
REFERRING PHYSICIAN:  Self  PROVIDER:  Carlean Jews, MD  MRN: [id number] DOB: 12/23/1955 DATE OF ENCOUNTER: 09/06/2020  Subjective   Chief Complaint: Lipoma (back)     History of Present Illness: Heather Huynh is a 65 y.o. female who is seen today as an office consultation at the request of Dr. Billey Gosling for evaluation of Lipoma (back) .    This is a healthy 65 year old female who presents with a 6 to 31-monthhistory of a palpable mass in her right mid back.  This has increased in size and discomfort over the last few months.  This area has never become infected.  No drainage from this area.  She was examined and this was felt to represent a lipoma.  [place] due to the discomfort and enlarging size.   Review of Systems: A complete review of systems was obtained from the patient.  I have reviewed this information and discussed as appropriate with the patient.  See HPI as well for other ROS.  Review of Systems  Constitutional: Negative.   HENT: Negative.   Eyes: Negative.   Respiratory: Negative.   Cardiovascular: Negative.   Gastrointestinal: Negative.   Genitourinary: Negative.   Musculoskeletal: Negative.   Skin: Negative.   Neurological: Negative.   Endo/Heme/Allergies: Negative.   Psychiatric/Behavioral: Negative.       Medical History: Past Medical History:  Diagnosis Date   Pyriformis syndrome, left 01/19/2015   Vision abnormalities     Patient Active Problem List  Diagnosis   Ocular rosacea   Tear film insufficiency, bilateral   Dry eye syndrome, bilateral   Acute sinus infection   Cavus deformity of foot, acquired   Dislocation of proximal interphalangeal joint of finger   Episodic lightheadedness   GERD (gastroesophageal reflux disease)   Hypoglycemia   Lipoma of torso   Pain in finger of left hand   Patellofemoral disorder of right knee   Pyriformis syndrome, left   Right Achilles tendinitis    Screening for skin cancer    Past Surgical History:  Procedure Laterality Date   rotator cuff surgery  N/A      Allergies  Allergen Reactions   Ampicillin Rash and Swelling   Erythromycin Lactobionate Rash and Swelling    Current Outpatient Medications on File Prior to Visit  Medication Sig Dispense Refill   famotidine (PEPCID) 20 MG tablet Take by mouth     diclofenac (VOLTAREN) 75 MG EC tablet  (Patient not taking: Reported on 06/07/2020)     iron polysacch/iron heme polyp (IRON POLYSAC-IRON HEME POLYPEP ORAL) Take by mouth (Patient not taking: Reported on 06/07/2020)     lifitegrast (XIIDRA) 5 % ophthalmic solution Place 1 drop into both eyes 2 (two) times daily 180 mL 4   omeprazole (PRILOSEC) 20 MG DR capsule  (Patient not taking: Reported on 06/07/2020)     No current facility-administered medications on file prior to visit.    Family History  Problem Relation Age of Onset   Breast cancer Mother    No Known Problems Father    No Known Problems Sister    No Known Problems Brother    No Known Problems Maternal Aunt    No Known Problems Maternal Uncle    No Known Problems Paternal Aunt    No Known Problems Paternal Uncle    No Known Problems Maternal Grandmother    No Known Problems Maternal Grandfather    No  Known Problems Paternal Grandmother    No Known Problems Paternal Grandfather    Allergic rhinitis Neg Hx    Amblyopia Neg Hx    Ankylosing spondylitis Neg Hx    Atrial fibrillation (Abnormal heart rhythm sometimes requiring treatment with blood thinners) Neg Hx    Basal cell carcinoma Neg Hx    Blindness Neg Hx    Cataracts Neg Hx    Coronary Artery Disease (Blocked arteries around heart) Neg Hx    Diabetes Neg Hx    Diabetes type I Neg Hx    Diabetes type II Neg Hx    Glaucoma Neg Hx    Graves' disease Neg Hx    Hashimoto's thyroiditis Neg Hx    High blood pressure (Hypertension) Neg Hx    Hyperthyroidism Neg Hx    Hypothyroidism Neg Hx    Macular  degeneration Neg Hx    Melanoma Neg Hx    Neuropathy Neg Hx    Renal Insufficiency Neg Hx    Retinal degeneration Neg Hx    Retinopathy of prematurity Neg Hx    Rheum arthritis Neg Hx    Sarcoidosis Neg Hx    Sinusitis Neg Hx    Sleep apnea Neg Hx    Strabismus Neg Hx    Thyroid disease Neg Hx    Vision loss Neg Hx      Social History   Tobacco Use  Smoking Status Never Smoker  Smokeless Tobacco Never Used     Social History   Socioeconomic History   Marital status: Unknown  Tobacco Use   Smoking status: Never Smoker   Smokeless tobacco: Never Used  Scientific laboratory technician Use: Never used  Substance and Sexual Activity   Alcohol use: Never   Drug use: Never    Objective:    Vitals:   09/06/20 1048  BP: 120/72  Pulse: 83  Temp: 36.8 C (98.3 F)  SpO2: 97%  Weight: 59.3 kg (130 lb 12.8 oz)  Height: 157.5 cm ('5\' 2"'$ )    Body mass index is 23.92 kg/m.  Physical Exam   Constitutional:  WDWN in NAD, conversant, no obvious deformities; lying in bed comfortably Eyes:  Pupils equal, round; sclera anicteric; moist conjunctiva; no lid lag HENT:  Oral mucosa moist; good dentition  Neck:  No masses palpated, trachea midline; no thyromegaly Lungs:  CTA bilaterally; normal respiratory effort Back:  Mid-thoracic back to the right of midline - palpable visible 3 cm subcutaneous mass; no overlying skin changes CV:  Regular rate and rhythm; no murmurs; extremities well-perfused with no edema Abd:  +bowel sounds, soft, non-tender, no palpable organomegaly; no palpable hernias Musc:  Unable to assess gait; no apparent clubbing or cyanosis in extremities Lymphatic:  No palpable cervical or axillary lymphadenopathy Skin:  Warm, dry; no sign of jaundice Psychiatric - alert and oriented x 4; calm mood and affect     Assessment and Plan:  Diagnoses and all orders for this visit:  Lipoma of back     Excision of subcutaneous lipoma of the back.The surgical procedure has  been discussed with the patient.  Potential risks, benefits, alternative treatments, and expected outcomes have been explained.  All of the patient's questions at this time have been answered.  The likelihood of reaching the patient's treatment goal is good.  The patient understand the proposed surgical procedure and wishes to proceed.   No follow-ups on file.  Carlean Jews, MD  09/06/2020 12:36 PM

## 2020-09-24 ENCOUNTER — Telehealth (INDEPENDENT_AMBULATORY_CARE_PROVIDER_SITE_OTHER): Payer: BC Managed Care – PPO | Admitting: Family Medicine

## 2020-09-24 ENCOUNTER — Encounter: Payer: Self-pay | Admitting: Family Medicine

## 2020-09-24 VITALS — Temp 98.9°F | Ht 62.0 in | Wt 127.0 lb

## 2020-09-24 DIAGNOSIS — B349 Viral infection, unspecified: Secondary | ICD-10-CM

## 2020-09-24 MED ORDER — HYDROCODONE BIT-HOMATROP MBR 5-1.5 MG/5ML PO SOLN: 5.0000 mL | Freq: Four times a day (QID) | ORAL | 0 refills | Status: DC | PRN

## 2020-09-24 NOTE — Progress Notes (Signed)
Established Patient Office Visit  Subjective:  Patient ID: Heather Huynh, female    DOB: [birth year]  Age: 65 y.o. MRN: [id number]  CC:  Chief Complaint  Patient presents with   Cough    Fever and cough started yesterday. Little congestion 2 days ago. Negative for covid     HPI Heather Huynh presents for 4-5 day ho nasal congestion, cough, myalgias, no phlegm.  Denies taste alteration, diarrhea, difficulty breathing, tightness in the chest or wheezing.  She is taking pseudoephedrine and Tylenol.  Tested negative for COVID.  Past Medical History:  Diagnosis Date   Achilles bursitis or tendinitis 03/05/2008   Qualifier: Diagnosis of  By: Laurance Flatten CMA, [place]     Anemia    GERD (gastroesophageal reflux disease)     Past Surgical History:  Procedure Laterality Date   ACL     CESAREAN SECTION     SHOULDER ARTHROSCOPY Right    TENDON REPAIR      Family History  Problem Relation Age of Onset   Breast cancer Mother    Breast cancer Maternal Grandmother    Heart disease Maternal Grandmother    Heart disease Maternal Grandfather    Sudden death Neg Hx    Hypertension Neg Hx    Hyperlipidemia Neg Hx    Heart attack Neg Hx    Diabetes Neg Hx    Colon cancer Neg Hx    Esophageal cancer Neg Hx    Stomach cancer Neg Hx    Rectal cancer Neg Hx     Social History   Socioeconomic History   Marital status: Married    Spouse name: Not on file   Number of children: Not on file   Years of education: Not on file   Highest education level: Not on file  Occupational History   Not on file  Tobacco Use   Smoking status: Never   Smokeless tobacco: Never  Substance and Sexual Activity   Alcohol use: Yes    Comment: rarely   Drug use: No   Sexual activity: Not on file  Other Topics Concern   Not on file  Social History Narrative   Exercising regularly   Social Determinants of Health   Financial Resource Strain: Not on file  Food Insecurity: Not on file  Transportation  Needs: Not on file  Physical Activity: Not on file  Stress: Not on file  Social Connections: Not on file  Intimate Partner Violence: Not on file    Outpatient Medications Prior to Visit  Medication Sig Dispense Refill   doxycycline (VIBRAMYCIN) 50 MG capsule Take 50 mg by mouth every other day.     Epinastine HCl 0.05 % ophthalmic solution      famotidine (PEPCID) 20 MG tablet Take 1 tablet (20 mg total) by mouth 2 (two) times daily.     Ferrous Sulfate (IRON) 325 (65 FE) MG TABS Take 1 tablet by mouth daily.     XIIDRA 5 % SOLN Apply 1 drop to eye 2 (two) times daily.     blood glucose meter kit and supplies KIT Dispense based on patient and insurance preference. Use up to four times daily as directed. For hypoglycemia 1 each 0   HYDROcodone-homatropine (HYCODAN) 5-1.5 MG/5ML syrup Take 5 mLs by mouth every 8 (eight) hours as needed for cough. (Patient not taking: Reported on 09/24/2020) 120 mL 0   No facility-administered medications prior to visit.    Allergies  Allergen Reactions   Ampicillin  Rash and Swelling    Swelling, red patches   Erythromycin Rash and Swelling    Swelling, red patches    ROS Review of Systems  HENT:  Positive for congestion, postnasal drip and sore throat.   Respiratory:  Positive for cough. Negative for shortness of breath and wheezing.   Gastrointestinal:  Negative for diarrhea.  Musculoskeletal:  Positive for arthralgias and myalgias.  Neurological:  Negative for headaches.     Objective:    Physical Exam Vitals and nursing note reviewed.  Constitutional:      General: She is not in acute distress.    Appearance: Normal appearance. She is not ill-appearing, toxic-appearing or diaphoretic.  Eyes:     General:        Right eye: No discharge.        Left eye: No discharge.  Pulmonary:     Effort: Pulmonary effort is normal.  Neurological:     Mental Status: She is alert and oriented to person, place, and time.  Psychiatric:        Mood  and Affect: Mood normal.        Behavior: Behavior normal.    Temp 98.9 F (37.2 C) (Oral)   Ht '5\' 2"'  (1.575 m)   Wt 127 lb (57.6 kg)   BMI 23.23 kg/m  Wt Readings from Last 3 Encounters:  09/24/20 127 lb (57.6 kg)  05/21/20 130 lb 3.2 oz (59.1 kg)  09/19/19 136 lb (61.7 kg)     Health Maintenance Due  Topic Date Due   PAP SMEAR-Modifier  Never done   Zoster Vaccines- Shingrix (1 of 2) Never done   MAMMOGRAM  07/21/2016   INFLUENZA VACCINE  09/13/2020    There are no preventive care reminders to display for this patient.  No results found for: TSH No results found for: WBC, HGB, HCT, MCV, PLT No results found for: NA, K, CHLORIDE, CO2, GLUCOSE, BUN, CREATININE, BILITOT, ALKPHOS, AST, ALT, PROT, ALBUMIN, CALCIUM, ANIONGAP, EGFR, GFR No results found for: CHOL No results found for: HDL No results found for: LDLCALC No results found for: TRIG No results found for: CHOLHDL No results found for: HGBA1C    Assessment & Plan:   Problem List Items Addressed This Visit   None Visit Diagnoses     Viral syndrome    -  Primary   Relevant Medications   HYDROcodone bit-homatropine (HYCODAN) 5-1.5 MG/5ML syrup       Meds ordered this encounter  Medications   HYDROcodone bit-homatropine (HYCODAN) 5-1.5 MG/5ML syrup    Sig: Take 5 mLs by mouth every 6 (six) hours as needed for cough.    Dispense:  120 mL    Refill:  0    Follow-up: Return in about 5 days (around 09/29/2020), or if symptoms worsen or fail to improve.   Continue Tylenol and pseudoephedrine as needed.  Follow-up next week if not improving. Libby Maw, MD  Virtual Visit via Video Note  I connected with Heather Huynh on 09/24/20 at  4:00 PM EDT by a video enabled telemedicine application and verified that I am speaking with the correct person using two identifiers.  Location: Patient: at home alone in her room.  Provider: work   I discussed the limitations of evaluation and management by  telemedicine and the availability of in person appointments. The patient expressed understanding and agreed to proceed.  History of Present Illness:    Observations/Objective:   Assessment and Plan:   Follow  Up Instructions:    I discussed the assessment and treatment plan with the patient. The patient was provided an opportunity to ask questions and all were answered. The patient agreed with the plan and demonstrated an understanding of the instructions.   The patient was advised to call back or seek an in-person evaluation if the symptoms worsen or if the condition fails to improve as anticipated.  I provided 20 minutes of non-face-to-face time during this encounter.   Libby Maw, MD

## 2020-11-10 DIAGNOSIS — M25551 Pain in right hip: Secondary | ICD-10-CM | POA: Diagnosis not present

## 2020-11-16 DIAGNOSIS — D171 Benign lipomatous neoplasm of skin and subcutaneous tissue of trunk: Secondary | ICD-10-CM | POA: Diagnosis not present

## 2020-12-20 ENCOUNTER — Other Ambulatory Visit: Payer: Self-pay | Admitting: Internal Medicine

## 2020-12-20 DIAGNOSIS — Z1231 Encounter for screening mammogram for malignant neoplasm of breast: Secondary | ICD-10-CM

## 2020-12-31 DIAGNOSIS — H04123 Dry eye syndrome of bilateral lacrimal glands: Secondary | ICD-10-CM | POA: Diagnosis not present

## 2020-12-31 DIAGNOSIS — H0288A Meibomian gland dysfunction right eye, upper and lower eyelids: Secondary | ICD-10-CM | POA: Diagnosis not present

## 2020-12-31 DIAGNOSIS — H16143 Punctate keratitis, bilateral: Secondary | ICD-10-CM | POA: Diagnosis not present

## 2020-12-31 DIAGNOSIS — H25813 Combined forms of age-related cataract, bilateral: Secondary | ICD-10-CM | POA: Diagnosis not present

## 2021-01-12 ENCOUNTER — Encounter: Payer: Self-pay | Admitting: Internal Medicine

## 2021-01-12 NOTE — Progress Notes (Signed)
Subjective:    Patient ID: Heather Huynh, female    DOB: Oct 17, 1955, 65 y.o.   MRN: [id number]  This visit occurred during the SARS-CoV-2 public health emergency.  Safety protocols were in place, including screening questions prior to the visit, additional usage of staff PPE, and extensive cleaning of exam room while observing appropriate contact time as indicated for disinfecting solutions.    HPI The patient is here for an acute visit.   Reflux issues - the famotidiine was working up until 3 weeks ago - she was very bloated and had dec appetite.  Her bowels are normal.  It took her a little rather figure out this may be reflux or an atypical reflux.  She took some tums and everything got better.  She has been taking tums- 2 every morning for a few days.  She has been eating a gerd diet.  Yesterday she took 1 tums in the morning and 2 at night.  Yesterday was the first time she did not feel bloated.  She is still taking the famotidine as prescribed.  She feels her symptoms are better, but is still taking some Tums.    Medications and allergies reviewed with patient and updated if appropriate.  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 Use up to four times daily as directed. For hypoglycemia 1 each 0   doxycycline (VIBRAMYCIN) 50 MG capsule Take 50 mg by mouth every  other day.     Epinastine HCl 0.05 % ophthalmic solution      famotidine (PEPCID) 20 MG tablet Take 1 tablet (20 mg total) by mouth 2 (two) times daily.     Ferrous Sulfate (IRON) 325 (65 FE) MG TABS Take 1 tablet by mouth daily.     XIIDRA 5 % SOLN Apply 1 drop to eye 2 (two) times daily.     No current facility-administered medications on file prior to visit.    Past Medical History:  Diagnosis Date   Achilles bursitis or tendinitis 03/05/2008   Qualifier: Diagnosis of  By: Laurance Flatten CMA, [place]     Anemia    GERD (gastroesophageal reflux disease)     Past Surgical History:  Procedure Laterality Date   ACL     CESAREAN SECTION     SHOULDER ARTHROSCOPY Right    TENDON REPAIR      Social History   Socioeconomic History   Marital status: Married    Spouse name: Not on file   Number of children: Not on file   Years of education: Not on file   Highest education level: Not on file  Occupational History   Not on file  Tobacco Use   Smoking status: Never   Smokeless tobacco: Never  Substance and Sexual Activity   Alcohol use: Yes    Comment: rarely   Drug use: No   Sexual activity: Not on file  Other  Topics Concern   Not on file  Social History Narrative   Exercising regularly   Social Determinants of Health   Financial Resource Strain: Not on file  Food Insecurity: Not on file  Transportation Needs: Not on file  Physical Activity: Not on file  Stress: Not on file  Social Connections: Not on file    Family History  Problem Relation Age of Onset   Breast cancer Mother    Breast cancer Maternal Grandmother    Heart disease Maternal Grandmother    Heart disease Maternal Grandfather    Sudden death Neg Hx    Hypertension Neg Hx    Hyperlipidemia Neg Hx    Heart attack Neg Hx    Diabetes Neg Hx    Colon cancer Neg Hx    Esophageal cancer Neg Hx    Stomach cancer Neg Hx    Rectal cancer Neg Hx     Review of Systems  Constitutional:  Negative for fever.   Gastrointestinal:  Positive for abdominal distention (Bloating improved). Negative for abdominal pain, blood in stool, constipation, diarrhea and nausea.      Objective:   Vitals:   01/13/21 0910  BP: 106/72  Pulse: 75  Temp: 98.1 F (36.7 C)  SpO2: 99%   BP Readings from Last 3 Encounters:  01/13/21 106/72  05/21/20 116/78  09/19/19 130/80   Wt Readings from Last 3 Encounters:  01/13/21 133 lb (60.3 kg)  09/24/20 127 lb (57.6 kg)  05/21/20 130 lb 3.2 oz (59.1 kg)   Body mass index is 24.33 kg/m.   Physical Exam         Assessment & Plan:    See Problem List for Assessment and Plan of chronic medical problems.

## 2021-01-12 NOTE — Patient Instructions (Addendum)
Use the omeprazole as needed    Gastroesophageal Reflux Disease, Adult Gastroesophageal reflux (GER) happens when acid from the stomach flows up into the tube that connects the mouth and the stomach (esophagus). Normally, food travels down the esophagus and stays in the stomach to be digested. However, when a person has GER, food and stomach acid sometimes move back up into the esophagus. If this becomes a more serious problem, the person may be diagnosed with a disease called gastroesophageal reflux disease (GERD). GERD occurs when the reflux: Happens often. Causes frequent or severe symptoms. Causes problems such as damage to the esophagus. When stomach acid comes in contact with the esophagus, the acid may cause inflammation in the esophagus. Over time, GERD may create small holes (ulcers) in the lining of the esophagus. What are the causes? This condition is caused by a problem with the muscle between the esophagus and the stomach (lower esophageal sphincter, or LES). Normally, the LES muscle closes after food passes through the esophagus to the stomach. When the LES is weakened or abnormal, it does not close properly, and that allows food and stomach acid to go back up into the esophagus. The LES can be weakened by certain dietary substances, medicines, and medical conditions, including: Tobacco use. Pregnancy. Having a hiatal hernia. Alcohol use. Certain foods and beverages, such as coffee, chocolate, onions, and peppermint. What increases the risk? You are more likely to develop this condition if you: Have an increased body weight. Have a connective tissue disorder. Take NSAIDs, such as ibuprofen. What are the signs or symptoms? Symptoms of this condition include: Heartburn. Difficult or painful swallowing and the feeling of having a lump in the throat. A bitter taste in the mouth. Bad breath and having a large amount of saliva. Having an upset or bloated stomach and  belching. Chest pain. Different conditions can cause chest pain. Make sure you see your health care provider if you experience chest pain. Shortness of breath or wheezing. Ongoing (chronic) cough or a nighttime cough. Wearing away of tooth enamel. Weight loss. How is this diagnosed? This condition may be diagnosed based on a medical history and a physical exam. To determine if you have mild or severe GERD, your health care provider may also monitor how you respond to treatment. You may also have tests, including: A test to examine your stomach and esophagus with a small camera (endoscopy). A test that measures the acidity level in your esophagus. A test that measures how much pressure is on your esophagus. A barium swallow or modified barium swallow test to show the shape, size, and functioning of your esophagus. How is this treated? Treatment for this condition may vary depending on how severe your symptoms are. Your health care provider may recommend: Changes to your diet. Medicine. Surgery. The goal of treatment is to help relieve your symptoms and to prevent complications. Follow these instructions at home: Eating and drinking  Follow a diet as recommended by your health care provider. This may involve avoiding foods and drinks such as: Coffee and tea, with or without caffeine. Drinks that contain alcohol. Energy drinks and sports drinks. Carbonated drinks or sodas. Chocolate and cocoa. Peppermint and mint flavorings. Garlic and onions. Horseradish. Spicy and acidic foods, including peppers, chili powder, curry powder, vinegar, hot sauces, and barbecue sauce. Citrus fruit juices and citrus fruits, such as oranges, lemons, and limes. Tomato-based foods, such as red sauce, chili, salsa, and pizza with red sauce. Fried and fatty foods, such as  donuts, french fries, potato chips, and high-fat dressings. High-fat meats, such as hot dogs and fatty cuts of red and white meats, such as  rib eye steak, sausage, ham, and bacon. High-fat dairy items, such as whole milk, butter, and cream cheese. Eat small, frequent meals instead of large meals. Avoid drinking large amounts of liquid with your meals. Avoid eating meals during the 2-3 hours before bedtime. Avoid lying down right after you eat. Do not exercise right after you eat. Lifestyle  Do not use any products that contain nicotine or tobacco. These products include cigarettes, chewing tobacco, and vaping devices, such as e-cigarettes. If you need help quitting, ask your health care provider. Try to reduce your stress by using methods such as yoga or meditation. If you need help reducing stress, ask your health care provider. If you are overweight, reduce your weight to an amount that is healthy for you. Ask your health care provider for guidance about a safe weight loss goal. General instructions Pay attention to any changes in your symptoms. Take over-the-counter and prescription medicines only as told by your health care provider. Do not take aspirin, ibuprofen, or other NSAIDs unless your health care provider told you to take these medicines. Wear loose-fitting clothing. Do not wear anything tight around your waist that causes pressure on your abdomen. Raise (elevate) the head of your bed about 6 inches (15 cm). You can use a wedge to do this. Avoid bending over if this makes your symptoms worse. Keep all follow-up visits. This is important. Contact a health care provider if: You have: New symptoms. Unexplained weight loss. Difficulty swallowing or it hurts to swallow. Wheezing or a persistent cough. A hoarse voice. Your symptoms do not improve with treatment. Get help right away if: You have sudden pain in your arms, neck, jaw, teeth, or back. You suddenly feel sweaty, dizzy, or light-headed. You have chest pain or shortness of breath. You vomit and the vomit is green, yellow, or black, or it looks like blood or  coffee grounds. You faint. You have stool that is red, bloody, or black. You cannot swallow, drink, or eat. These symptoms may represent a serious problem that is an emergency. Do not wait to see if the symptoms will go away. Get medical help right away. Call your local emergency services (911 in the [place] Do not drive yourself to the hospital. Summary Gastroesophageal reflux happens when acid from the stomach flows up into the esophagus. GERD is a disease in which the reflux happens often, causes frequent or severe symptoms, or causes problems such as damage to the esophagus. Treatment for this condition may vary depending on how severe your symptoms are. Your health care provider may recommend diet and lifestyle changes, medicine, or surgery. Contact a health care provider if you have new or worsening symptoms. Take over-the-counter and prescription medicines only as told by your health care provider. Do not take aspirin, ibuprofen, or other NSAIDs unless your health care provider told you to do so. Keep all follow-up visits as told by your health care provider. This is important. This information is not intended to replace advice given to you by your health care provider. Make sure you discuss any questions you have with your health care provider. Document Revised: 08/11/2019 Document Reviewed: 08/11/2019 Elsevier Patient Education  [place].

## 2021-01-13 ENCOUNTER — Ambulatory Visit: Payer: BC Managed Care – PPO | Admitting: Internal Medicine

## 2021-01-13 ENCOUNTER — Other Ambulatory Visit: Payer: Self-pay

## 2021-01-13 VITALS — BP 106/72 | HR 75 | Temp 98.1°F | Ht 62.0 in | Wt 133.0 lb

## 2021-01-13 DIAGNOSIS — K219 Gastro-esophageal reflux disease without esophagitis: Secondary | ICD-10-CM

## 2021-01-13 NOTE — Assessment & Plan Note (Addendum)
Acute on chronic Recent flare, but symptoms have improved with Tums-no obvious reason for the flare GERD diet Continue famotidine 20 mg twice daily Discussed for flare that she can take omeprazole 20 mg daily for a few days-2 weeks if needed, take Tums but would not recommend taking this for too long over time.  We will increase the famotidine to up to 40 mg twice daily.  Once flare has resolved can decrease may need medication back to baseline of 20 mg of famotidine twice daily Discussed the importance of getting heartburn controlled clinically with stronger medication and then decreasing her medication  She will let me know if her symptoms do not continue to improve/resolve or if she has any questions

## 2021-01-20 ENCOUNTER — Ambulatory Visit
Admission: RE | Admit: 2021-01-20 | Discharge: 2021-01-20 | Disposition: A | Discharge status: Home / Self Care | Payer: BC Managed Care – PPO | Source: Ambulatory Visit | Attending: Internal Medicine | Admitting: Internal Medicine

## 2021-01-20 DIAGNOSIS — Z1231 Encounter for screening mammogram for malignant neoplasm of breast: Secondary | ICD-10-CM

## 2021-02-11 DIAGNOSIS — M25551 Pain in right hip: Secondary | ICD-10-CM | POA: Diagnosis not present

## 2021-02-21 ENCOUNTER — Encounter: Payer: Self-pay | Admitting: Internal Medicine

## 2021-02-21 NOTE — Progress Notes (Signed)
Subjective:    Patient ID: Heather Huynh, female    DOB: 03-Oct-1955, 66 y.o.   MRN: [id number]   This visit occurred during the SARS-CoV-2 public health emergency.  Safety protocols were in place, including screening questions prior to the visit, additional usage of staff PPE, and extensive cleaning of exam room while observing appropriate contact time as indicated for disinfecting solutions.    HPI  She is here for pre-operative clearance at the request of Dr Leanne Lovely for facial cosmetic surgery scheduled for the end of February 2023.   She denies any personal or family history of problems with anesthesia or bleeding/blood clot problems.    She has no concerns.  She is taking her medication as prescribed.  Her GERD is controlled.  She is exercising regularly.  With her daily activities she denies chest pain, palpitations, SOB and lightheadedness.       Medications and allergies reviewed with patient and updated if appropriate.  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 Use up to four times daily as directed. For hypoglycemia 1 each 0   doxycycline (VIBRAMYCIN) 50 MG capsule Take 50 mg by mouth every other day.     Epinastine HCl 0.05 % ophthalmic solution      famotidine (PEPCID) 20 MG tablet Take 1 tablet (20 mg total) by mouth 2 (two) times daily.      Ferrous Sulfate (IRON) 325 (65 FE) MG TABS Take 1 tablet by mouth daily.     XIIDRA 5 % SOLN Apply 1 drop to eye 2 (two) times daily.     No current facility-administered medications on file prior to visit.    Past Medical History:  Diagnosis Date   Achilles bursitis or tendinitis 03/05/2008   Qualifier: Diagnosis of  By: Laurance Flatten CMA, [place]     Anemia    GERD (gastroesophageal reflux disease)     Past Surgical History:  Procedure Laterality Date   ACL     CESAREAN SECTION     SHOULDER ARTHROSCOPY Right    TENDON REPAIR      Social History   Socioeconomic History   Marital status: Married    Spouse name: Not on file   Number of children: Not on file   Years of education: Not on file   Highest education level: Not on file  Occupational History   Not on file  Tobacco Use   Smoking status: Never   Smokeless tobacco: Never  Substance and Sexual Activity   Alcohol use: Yes    Comment: rarely   Drug use: No   Sexual activity: Not on file  Other Topics Concern   Not on file  Social History Narrative   Exercising regularly   Social Determinants of Health   Financial Resource Strain: Not on file  Food Insecurity: Not on file  Transportation Needs: Not on file  Physical Activity: Not on  file  Stress: Not on file  Social Connections: Not on file    Family History  Problem Relation Age of Onset   Breast cancer Mother    Breast cancer Maternal Grandmother    Heart disease Maternal Grandmother    Heart disease Maternal Grandfather    Sudden death Neg Hx    Hypertension Neg Hx    Hyperlipidemia Neg Hx    Heart attack Neg Hx    Diabetes Neg Hx    Colon cancer Neg Hx    Esophageal cancer Neg Hx    Stomach cancer Neg Hx    Rectal cancer Neg Hx     Review of Systems  Constitutional:  Negative for chills and fever.  Eyes:  Negative for visual disturbance.  Respiratory:  Negative for cough, shortness of breath and wheezing.   Cardiovascular:  Negative for chest  pain, palpitations and leg swelling.  Gastrointestinal:  Negative for abdominal pain, blood in stool, constipation, diarrhea and nausea.  Genitourinary:  Negative for dysuria and hematuria.  Musculoskeletal:  Negative for arthralgias and back pain.  Skin:  Negative for rash.  Neurological:  Negative for dizziness, light-headedness, numbness and headaches.  Psychiatric/Behavioral:  Negative for dysphoric mood.       Objective:   Vitals:   02/22/21 1444  BP: 104/70  Pulse: 89  Temp: 98.1 F (36.7 C)  SpO2: 98%   Filed Weights   02/22/21 1444  Weight: 135 lb (61.2 kg)   Body mass index is 24.69 kg/m.  BP Readings from Last 3 Encounters:  02/22/21 104/70  01/13/21 106/72  05/21/20 116/78    Wt Readings from Last 3 Encounters:  02/22/21 135 lb (61.2 kg)  01/13/21 133 lb (60.3 kg)  09/24/20 127 lb (57.6 kg)      Physical Exam Constitutional: She appears well-developed and well-nourished. No distress.  HENT:  Head: Normocephalic and atraumatic.  Right Ear: External ear normal. Normal ear canal and TM Left Ear: External ear normal.  Normal ear canal and TM Mouth/Throat: Oropharynx is clear and moist.  Eyes: Conjunctivae and EOM are normal.  Neck: Neck supple. No tracheal deviation present. No thyromegaly present.  No carotid bruit  Cardiovascular: Normal rate, regular rhythm and normal heart sounds.   No murmur heard.  No edema. Pulmonary/Chest: Effort normal and breath sounds normal. No respiratory distress. She has no wheezes. She has no rales.  Abdominal: Soft. She exhibits no distension. There is no tenderness.  Lymphadenopathy: She has no cervical adenopathy.  Skin: Skin is warm and dry. She is not diaphoretic.  Psychiatric: She has a normal mood and affect. Her behavior is normal.     Lab Results  Component Value Date   WBC 11.1 (H) 02/22/2021   HGB 13.4 02/22/2021   HCT 41.6 02/22/2021   PLT 323.0 02/22/2021   GLUCOSE 94 02/22/2021   ALT 19 02/22/2021    AST 21 02/22/2021   NA 138 02/22/2021   K 4.8 02/22/2021   CL 105 02/22/2021   CREATININE 1.12 02/22/2021   BUN 26 (H) 02/22/2021   CO2 28 02/22/2021   INR 1.0 02/22/2021   HGBA1C 5.9 02/22/2021    EKG: Normal sinus rhythm, possible left atrial enlargement, RSR, normal EKG.  No change compared to previous EKG from 2016.     Assessment & Plan:       See Problem List for Assessment and Plan of chronic medical problems.

## 2021-02-22 ENCOUNTER — Encounter: Payer: Self-pay | Admitting: Internal Medicine

## 2021-02-22 ENCOUNTER — Ambulatory Visit: Payer: BC Managed Care – PPO | Admitting: Internal Medicine

## 2021-02-22 ENCOUNTER — Other Ambulatory Visit: Payer: Self-pay

## 2021-02-22 VITALS — BP 104/70 | HR 89 | Temp 98.1°F | Ht 62.0 in | Wt 135.0 lb

## 2021-02-22 DIAGNOSIS — Z01818 Encounter for other preprocedural examination: Secondary | ICD-10-CM | POA: Insufficient documentation

## 2021-02-22 DIAGNOSIS — R944 Abnormal results of kidney function studies: Secondary | ICD-10-CM | POA: Insufficient documentation

## 2021-02-22 DIAGNOSIS — K219 Gastro-esophageal reflux disease without esophagitis: Secondary | ICD-10-CM

## 2021-02-22 DIAGNOSIS — R7303 Prediabetes: Secondary | ICD-10-CM | POA: Insufficient documentation

## 2021-02-22 LAB — COMPREHENSIVE METABOLIC PANEL
ALT: 19 U/L (ref 0–35)
AST: 21 U/L (ref 0–37)
Albumin: 4.4 g/dL (ref 3.5–5.2)
Alkaline Phosphatase: 71 U/L (ref 39–117)
BUN: 26 mg/dL — ABNORMAL HIGH (ref 6–23)
CO2: 28 mEq/L (ref 19–32)
Calcium: 9.5 mg/dL (ref 8.4–10.5)
Chloride: 105 mEq/L (ref 96–112)
Creatinine, Ser: 1.12 mg/dL (ref 0.40–1.20)
GFR: 51.67 mL/min — ABNORMAL LOW (ref >60.00)
Glucose, Bld: 94 mg/dL (ref 70–99)
Potassium: 4.8 mEq/L (ref 3.5–5.1)
Sodium: 138 mEq/L (ref 135–145)
Total Bilirubin: 0.3 mg/dL (ref 0.2–1.2)
Total Protein: 6.9 g/dL (ref 6.0–8.3)

## 2021-02-22 LAB — CBC WITH DIFFERENTIAL/PLATELET
Basophils Absolute: 0.1 10*3/uL (ref 0.0–0.1)
Basophils Relative: 0.8 % (ref 0.0–3.0)
Eosinophils Absolute: 0.1 10*3/uL (ref 0.0–0.7)
Eosinophils Relative: 1.1 % (ref 0.0–5.0)
HCT: 41.6 % (ref 36.0–46.0)
Hemoglobin: 13.4 g/dL (ref 12.0–15.0)
Lymphocytes Relative: 29.5 % (ref 12.0–46.0)
Lymphs Abs: 3.3 10*3/uL (ref 0.7–4.0)
MCHC: 32.3 g/dL (ref 30.0–36.0)
MCV: 94.2 fl (ref 78.0–100.0)
Monocytes Absolute: 0.9 10*3/uL (ref 0.1–1.0)
Monocytes Relative: 8.3 % (ref 3.0–12.0)
Neutro Abs: 6.7 10*3/uL (ref 1.4–7.7)
Neutrophils Relative %: 60.3 % (ref 43.0–77.0)
Platelets: 323 10*3/uL (ref 150.0–400.0)
RBC: 4.41 Mil/uL (ref 3.87–5.11)
RDW: 13.5 % (ref 11.5–15.5)
WBC: 11.1 10*3/uL — ABNORMAL HIGH (ref 4.0–10.5)

## 2021-02-22 LAB — PROTIME-INR
INR: 1 ratio (ref 0.8–1.0)
Prothrombin Time: 11.4 s (ref 9.6–13.1)

## 2021-02-22 LAB — HEMOGLOBIN A1C: Hgb A1c MFr Bld: 5.9 % (ref 4.6–6.5)

## 2021-02-22 LAB — APTT: aPTT: 28.6 s (ref 23.4–32.7)

## 2021-02-22 NOTE — Assessment & Plan Note (Signed)
Chronic GERD controlled Continue famotidine 20 mg twice daily 

## 2021-02-22 NOTE — Assessment & Plan Note (Addendum)
Her chronic medical problems are stable and she is no history or symptoms suggestive of cardiac or pulmonary disease.  Her EKG was reviewed today and is normal.  Her blood work has been ordered and will be reviewed, but expect this to be normal.   She is at low risk, from a medical and cardiac standpoint for her upcoming cosmetic procedures.  We will fill out paperwork and send to surgeon in addition to copy of blood work and EKG

## 2021-02-22 NOTE — Patient Instructions (Signed)
° ° ° °  Blood work was ordered.     Your EKG is normal.    Medications changes include :  none     We will send all the information to your surgeon.

## 2021-06-06 DIAGNOSIS — H02423 Myogenic ptosis of bilateral eyelids: Secondary | ICD-10-CM | POA: Diagnosis not present

## 2021-06-06 DIAGNOSIS — H02834 Dermatochalasis of left upper eyelid: Secondary | ICD-10-CM | POA: Diagnosis not present

## 2021-06-06 DIAGNOSIS — H57813 Brow ptosis, bilateral: Secondary | ICD-10-CM | POA: Diagnosis not present

## 2021-06-06 DIAGNOSIS — H02831 Dermatochalasis of right upper eyelid: Secondary | ICD-10-CM | POA: Diagnosis not present

## 2021-11-08 DIAGNOSIS — H57813 Brow ptosis, bilateral: Secondary | ICD-10-CM | POA: Diagnosis not present

## 2021-12-19 DIAGNOSIS — H04123 Dry eye syndrome of bilateral lacrimal glands: Secondary | ICD-10-CM | POA: Diagnosis not present

## 2021-12-19 DIAGNOSIS — H16143 Punctate keratitis, bilateral: Secondary | ICD-10-CM | POA: Diagnosis not present

## 2021-12-19 DIAGNOSIS — H0288B Meibomian gland dysfunction left eye, upper and lower eyelids: Secondary | ICD-10-CM | POA: Diagnosis not present

## 2021-12-19 DIAGNOSIS — H25813 Combined forms of age-related cataract, bilateral: Secondary | ICD-10-CM | POA: Diagnosis not present

## 2021-12-19 DIAGNOSIS — H0288A Meibomian gland dysfunction right eye, upper and lower eyelids: Secondary | ICD-10-CM | POA: Diagnosis not present

## 2021-12-21 ENCOUNTER — Other Ambulatory Visit: Payer: Self-pay | Admitting: Internal Medicine

## 2021-12-21 DIAGNOSIS — Z1231 Encounter for screening mammogram for malignant neoplasm of breast: Secondary | ICD-10-CM

## 2022-01-02 DIAGNOSIS — M25551 Pain in right hip: Secondary | ICD-10-CM | POA: Diagnosis not present

## 2022-01-12 ENCOUNTER — Encounter: Payer: Self-pay | Admitting: Internal Medicine

## 2022-01-12 ENCOUNTER — Ambulatory Visit: Payer: BC Managed Care – PPO | Admitting: Internal Medicine

## 2022-01-12 VITALS — BP 112/62 | HR 73 | Temp 97.8°F | Ht 62.0 in | Wt 140.0 lb

## 2022-01-12 DIAGNOSIS — R062 Wheezing: Secondary | ICD-10-CM | POA: Insufficient documentation

## 2022-01-12 DIAGNOSIS — R7303 Prediabetes: Secondary | ICD-10-CM

## 2022-01-12 DIAGNOSIS — R059 Cough, unspecified: Secondary | ICD-10-CM | POA: Insufficient documentation

## 2022-01-12 LAB — POCT RESPIRATORY SYNCYTIAL VIRUS: RSV Rapid Ag: NEGATIVE

## 2022-01-12 LAB — POC SOFIA SARS ANTIGEN FIA: SARS Coronavirus 2 Ag: NEGATIVE

## 2022-01-12 LAB — POC INFLUENZA A&B (BINAX/QUICKVUE)
Influenza A, POC: NEGATIVE
Influenza B, POC: NEGATIVE

## 2022-01-12 MED ORDER — LEVOFLOXACIN 500 MG PO TABS: 500.0000 mg | ORAL_TABLET | Freq: Every day | ORAL | 0 refills | Status: AC

## 2022-01-12 MED ORDER — ALBUTEROL SULFATE HFA 108 (90 BASE) MCG/ACT IN AERS: 2.0000 | INHALATION_SPRAY | Freq: Four times a day (QID) | RESPIRATORY_TRACT | 0 refills | Status: DC | PRN

## 2022-01-12 MED ORDER — HYDROCODONE BIT-HOMATROP MBR 5-1.5 MG/5ML PO SOLN: 5.0000 mL | Freq: Four times a day (QID) | ORAL | 0 refills | Status: AC | PRN

## 2022-01-12 MED ORDER — PREDNISONE 10 MG PO TABS: ORAL_TABLET | ORAL | 0 refills | Status: DC

## 2022-01-12 NOTE — Assessment & Plan Note (Signed)
Mild to mod, for prednisone taper, albuterol hfa prn,  to f/u any worsening symptoms or concerns

## 2022-01-12 NOTE — Assessment & Plan Note (Signed)
Lab Results  Component Value Date   HGBA1C 5.9 02/22/2021   Stable, pt to continue current medical treatment  - diet, wt control, excercise

## 2022-01-12 NOTE — Patient Instructions (Addendum)
Your COVID, Flu and RSV are negative  Please take all new medication as prescribed  - the antibiotic, cough medicine as needed, prednisone, and inhaler as needed  Please continue all other medications as before, and refills have been done if requested.  Please have the pharmacy call with any other refills you may need.  Please keep your appointments with your specialists as you may have planned

## 2022-01-12 NOTE — Progress Notes (Signed)
Patient ID: Heather Huynh, female   DOB: 1955-04-24, 66 y.o.   MRN: [id number]        Chief Complaint: follow up cough and wheezing       HPI:  Heather Huynh is a 65 y.o. female Here with acute onset mild to mod 8 days ST, HA, general weakness and malaise, with prod cough greenish sputum, but Pt denies chest pain, increased sob or doe, wheezing, orthopnea, PND, increased LE swelling, palpitations, dizziness or syncope. Except for onset mild sob doe wheezing in the last 2 days.  Pt denies polydipsia, polyuria, or new focal neuro s/s.    Pt denies high fever, wt loss, night sweats, loss of appetite, or other constitutional symptoms    Wt Readings from Last 3 Encounters:  01/12/22 140 lb (63.5 kg)  02/22/21 135 lb (61.2 kg)  01/13/21 133 lb (60.3 kg)   BP Readings from Last 3 Encounters:  01/12/22 112/62  02/22/21 104/70  01/13/21 106/72         Past Medical History:  Diagnosis Date   Achilles bursitis or tendinitis 03/05/2008   Qualifier: Diagnosis of  By: Laurance Flatten CMA, [place]     Anemia    GERD (gastroesophageal reflux disease)    Past Surgical History:  Procedure Laterality Date   ACL     CESAREAN SECTION     SHOULDER ARTHROSCOPY Right    TENDON REPAIR      reports that she has never smoked. She has never used smokeless tobacco. She reports current alcohol use. She reports that she does not use drugs. family history includes Breast cancer in her maternal grandmother and mother; Heart disease in her maternal grandfather and maternal grandmother. Allergies  Allergen Reactions   Ampicillin Rash and Swelling    Swelling, red patches   Erythromycin Rash and Swelling    Swelling, red patches   Current Outpatient Medications on File Prior to Visit  Medication Sig Dispense Refill   blood glucose meter kit and supplies KIT Dispense based on patient and insurance preference. Use up to four times daily as directed. For hypoglycemia 1 each 0   doxycycline (VIBRAMYCIN) 50 MG capsule  Take 50 mg by mouth every other day.     Epinastine HCl 0.05 % ophthalmic solution      famotidine (PEPCID) 20 MG tablet Take 1 tablet (20 mg total) by mouth 2 (two) times daily.     Ferrous Sulfate (IRON) 325 (65 FE) MG TABS Take 1 tablet by mouth daily.     XIIDRA 5 % SOLN Apply 1 drop to eye 2 (two) times daily.     No current facility-administered medications on file prior to visit.        ROS:  All others reviewed and negative.  Objective        PE:  BP 112/62 (BP Location: Left Arm, Patient Position: Sitting, Cuff Size: Large)   Pulse 73   Temp 97.8 F (36.6 C) (Oral)   Ht _0  (1.575 m)   Wt 140 lb (63.5 kg)   SpO2 95%   BMI 25.61 kg/m                 Constitutional: Pt appears in NAD               HENT: Head: NCAT.                Right Ear: External ear normal.  Left Ear: External ear normal.  Bilat tm's with mild erythema.  Max sinus areas mild tender.  Pharynx with mild erythema, no exudate               Eyes: . Pupils are equal, round, and reactive to light. Conjunctivae and EOM are normal               Nose: without d/c or deformity               Neck: Neck supple. Gross normal ROM               Cardiovascular: Normal rate and regular rhythm.                 Pulmonary/Chest: Effort normal and breath sounds without rales with bilat few wheezing.                Abd:  Soft, NT, ND, + BS, no organomegaly               Neurological: Pt is alert. At baseline orientation, motor grossly intact               Skin: Skin is warm. No rashes, no other new lesions, LE edema - none               Psychiatric: Pt behavior is normal without agitation   Micro: none  Cardiac tracings I have personally interpreted today:  none  Pertinent Radiological findings (summarize): none   Lab Results  Component Value Date   WBC 11.1 (H) 02/22/2021   HGB 13.4 02/22/2021   HCT 41.6 02/22/2021   PLT 323.0 02/22/2021   GLUCOSE 94 02/22/2021   ALT 19 02/22/2021   AST 21  02/22/2021   NA 138 02/22/2021   K 4.8 02/22/2021   CL 105 02/22/2021   CREATININE 1.12 02/22/2021   BUN 26 (H) 02/22/2021   CO2 28 02/22/2021   INR 1.0 02/22/2021   HGBA1C 5.9 02/22/2021   POCT - COVID - neg, Flu A/B - neg, RSV - neg  Assessment/Plan:  Heather Huynh is a 66 y.o. White or Caucasian [1] female with  has a past medical history of Achilles bursitis or tendinitis (03/05/2008), Anemia, and GERD (gastroesophageal reflux disease).  Cough Mild to mod, c/w bronchitis vs pna, for antibx course zpack, cough med prn,,  to f/u any worsening symptoms or concerns for cxr   Wheezing Mild to mod, for prednisone taper, albuterol hfa prn,  to f/u any worsening symptoms or concerns   Prediabetes Lab Results  Component Value Date   HGBA1C 5.9 02/22/2021   Stable, pt to continue current medical treatment  - diet, wt control, excercise  Followup: Return if symptoms worsen or fail to improve.  Cathlean Cower, MD 01/12/2022 5:01 PM [facility]

## 2022-01-12 NOTE — Assessment & Plan Note (Addendum)
Mild to mod, c/w bronchitis vs pna, for antibx course zpack, cough med prn,,  to f/u any worsening symptoms or concerns for cxr

## 2022-01-26 DIAGNOSIS — M25511 Pain in right shoulder: Secondary | ICD-10-CM | POA: Diagnosis not present

## 2022-02-08 ENCOUNTER — Other Ambulatory Visit: Payer: Self-pay | Admitting: Internal Medicine

## 2022-02-24 ENCOUNTER — Ambulatory Visit: Payer: BC Managed Care – PPO

## 2022-03-10 ENCOUNTER — Ambulatory Visit
Admission: RE | Admit: 2022-03-10 | Discharge: 2022-03-10 | Disposition: A | Discharge status: Home / Self Care | Payer: BC Managed Care – PPO | Source: Ambulatory Visit | Attending: Internal Medicine | Admitting: Internal Medicine

## 2022-03-10 DIAGNOSIS — Z1231 Encounter for screening mammogram for malignant neoplasm of breast: Secondary | ICD-10-CM | POA: Diagnosis not present

## 2022-03-13 DIAGNOSIS — M25511 Pain in right shoulder: Secondary | ICD-10-CM | POA: Diagnosis not present

## 2022-03-13 DIAGNOSIS — M25561 Pain in right knee: Secondary | ICD-10-CM | POA: Diagnosis not present

## 2022-03-23 DIAGNOSIS — M25511 Pain in right shoulder: Secondary | ICD-10-CM | POA: Diagnosis not present

## 2022-03-23 DIAGNOSIS — M1711 Unilateral primary osteoarthritis, right knee: Secondary | ICD-10-CM | POA: Diagnosis not present

## 2022-03-27 DIAGNOSIS — H25813 Combined forms of age-related cataract, bilateral: Secondary | ICD-10-CM | POA: Diagnosis not present

## 2022-03-27 DIAGNOSIS — H0288A Meibomian gland dysfunction right eye, upper and lower eyelids: Secondary | ICD-10-CM | POA: Diagnosis not present

## 2022-03-27 DIAGNOSIS — H04123 Dry eye syndrome of bilateral lacrimal glands: Secondary | ICD-10-CM | POA: Diagnosis not present

## 2022-03-27 DIAGNOSIS — H16143 Punctate keratitis, bilateral: Secondary | ICD-10-CM | POA: Diagnosis not present

## 2022-03-30 DIAGNOSIS — M1711 Unilateral primary osteoarthritis, right knee: Secondary | ICD-10-CM | POA: Diagnosis not present

## 2022-04-06 DIAGNOSIS — M1711 Unilateral primary osteoarthritis, right knee: Secondary | ICD-10-CM | POA: Diagnosis not present

## 2022-04-06 DIAGNOSIS — M19011 Primary osteoarthritis, right shoulder: Secondary | ICD-10-CM | POA: Diagnosis not present

## 2022-04-10 DIAGNOSIS — M25551 Pain in right hip: Secondary | ICD-10-CM | POA: Diagnosis not present

## 2022-04-28 DIAGNOSIS — H25813 Combined forms of age-related cataract, bilateral: Secondary | ICD-10-CM | POA: Diagnosis not present

## 2022-04-28 DIAGNOSIS — H04123 Dry eye syndrome of bilateral lacrimal glands: Secondary | ICD-10-CM | POA: Diagnosis not present

## 2022-04-28 DIAGNOSIS — H16143 Punctate keratitis, bilateral: Secondary | ICD-10-CM | POA: Diagnosis not present

## 2022-04-28 DIAGNOSIS — H0288A Meibomian gland dysfunction right eye, upper and lower eyelids: Secondary | ICD-10-CM | POA: Diagnosis not present

## 2022-09-15 ENCOUNTER — Ambulatory Visit: Payer: BC Managed Care – PPO | Admitting: Sports Medicine

## 2022-09-15 ENCOUNTER — Other Ambulatory Visit: Payer: Self-pay

## 2022-09-15 VITALS — BP 110/72 | Ht 62.0 in | Wt 129.0 lb

## 2022-09-15 DIAGNOSIS — M7662 Achilles tendinitis, left leg: Secondary | ICD-10-CM

## 2022-09-15 MED ORDER — NITROGLYCERIN 0.2 MG/HR TD PT24: MEDICATED_PATCH | TRANSDERMAL | 1 refills | Status: DC

## 2022-09-15 NOTE — Progress Notes (Addendum)
PCP: Pincus Sanes, MD  SUBJECTIVE:   HPI:  Patient is a 67 y.o. female here with chief complaint of left posterior ankle/heel pain.  She is a former Product/process development scientist and has a history of right Achilles tendinopathy that she treated with home exercise program and nitroglycerin protocol through our office a few years ago.  She now states that over the past 3 to 3-1/2 months she is having tightness and pain in the left Achilles that feels similar to how her right was with previous tendinopathy.  She has been treating this with both eccentric and concentric heel lifts with knee extension and 30 degree flexion over the past 3 months.  She initially noted some improvement however had a setback in her recovery about 4 weeks ago and has plateaued in improvement.  She is biking once weekly roughly 10 to 14 miles and light elliptical use 5 to 6 days/week avoiding high intensity exercise.   ROS:     See HPI  PERTINENT  PMH / PSH FH / / SH:  Past Medical, Surgical, Social, and Family History Reviewed & Updated in the EMR.  Pertinent findings include:  Achilles Tendinopathy, Right ACL Tear and cleanout (Dr. Thurston Hole)   Allergies  Allergen Reactions   Ampicillin Rash and Swelling    Swelling, red patches   Erythromycin Rash and Swelling    Swelling, red patches    OBJECTIVE:  BP 110/72   Ht 5\' 2"  (1.575 m)   Wt 129 lb (58.5 kg)   BMI 23.59 kg/m   PHYSICAL EXAM:  GEN: Alert and Oriented, NAD, comfortable in exam room RESP: Unlabored respirations, symmetric chest rise PSY: normal mood, congruent affect   MSK EXAM Left Ankle: No swelling, erythema, ecchymosis, abrasions, or visible deformity. Tenderness to palpation at musculotendinous junction of left Achilles most prominently down through her mid substance Achilles as well as the left Achilles insertion site into the calcaneus.  She has full range of motion in her left ankle, no pain with achilles stretch.  Strength 5 out of 5.  Neurovascularly  intact distally.  Negative anterior drawer and talar tilt testing.  Limited MSK ultrasound of the left Achilles tendon shows slight edema within the mid substance of the Achilles tendon and mid stubstance thickness of 0.67cm. Bone spur and edema noted at calcaneal insertion with thickness of 0.48cm. No obvious tearing noted.     ASSESSMENT & PLAN:  1. Left Achilles tendinitis History, exam, and ultrasound consistent with left Achilles tendinitis.  She has tried conservative management thus far with plateaued improvement.  We discussed her home exercise program and advised her to avoid concentric exercise for the next 6 weeks while improving will focus solely on eccentric exercises.  Rx for nitroglycerin patches and instructions shared for nitroglycerin protocol. Green insert and heel lift padding 5/16th in provided today. Follow-up in 4 to 6 weeks to monitor progress.  Can always consider ECSWT in the future if failing to improve.   Glean Salen, MD PGY-4, Sports Medicine Fellow [facility]  Patient seen and evaluated with the sports medicine fellow.  I agree with the above plan of care.  Treatment as above with topical nitroglycerin, eccentric exercises, and heel lifts.  Follow-up in 4 weeks for check on progress.  We did mention the possibility of soundwave treatment or formal physical therapy if symptoms are not improving.

## 2022-09-15 NOTE — Patient Instructions (Signed)

## 2022-10-08 IMAGING — MG MM DIGITAL SCREENING BILAT W/ TOMO AND CAD
8 series · 9 of 24 positions shown · non-contrast
Comparison: Previous exam(s).

CLINICAL DATA: Screening.

EXAM:
DIGITAL SCREENING BILATERAL MAMMOGRAM WITH TOMOSYNTHESIS AND CAD
TECHNIQUE: Bilateral screening digital craniocaudal and mediolateral oblique
mammograms were obtained. Bilateral screening digital breast
tomosynthesis was performed. The images were evaluated with
computer-aided detection.

[L MLO synth-2D]
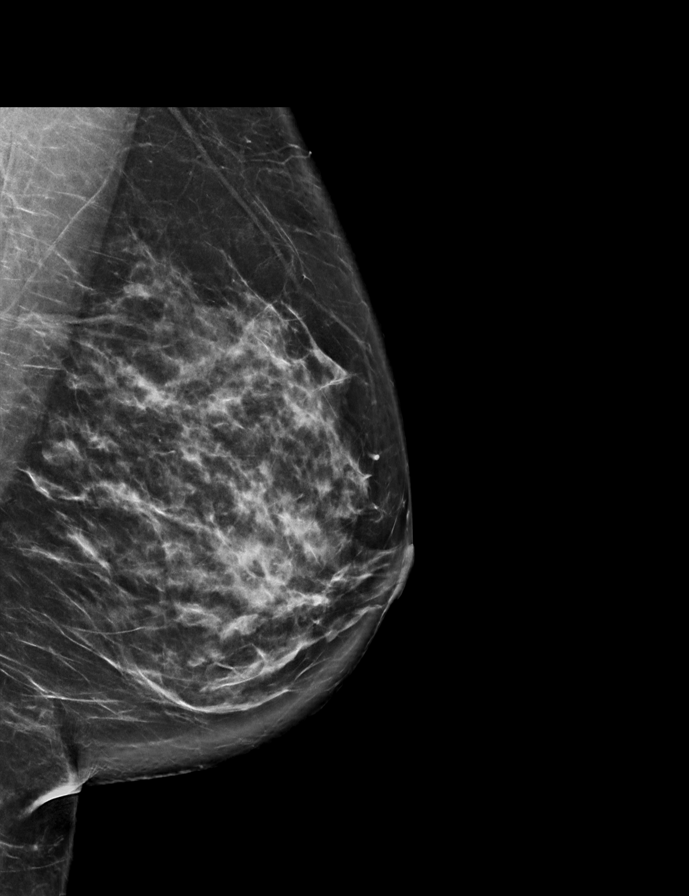

[R CC synth-2D]
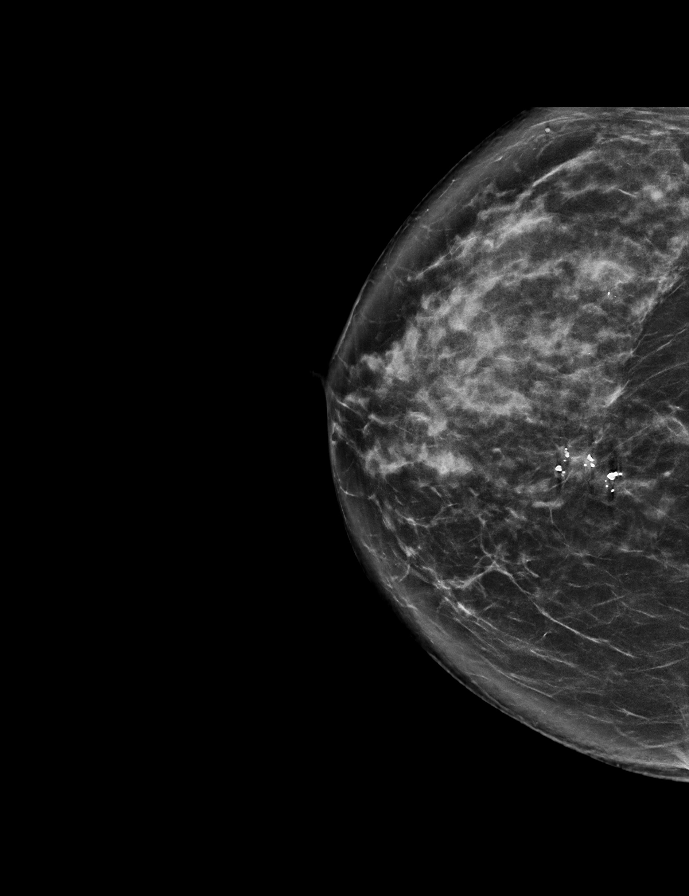

[R MLO synth-2D]
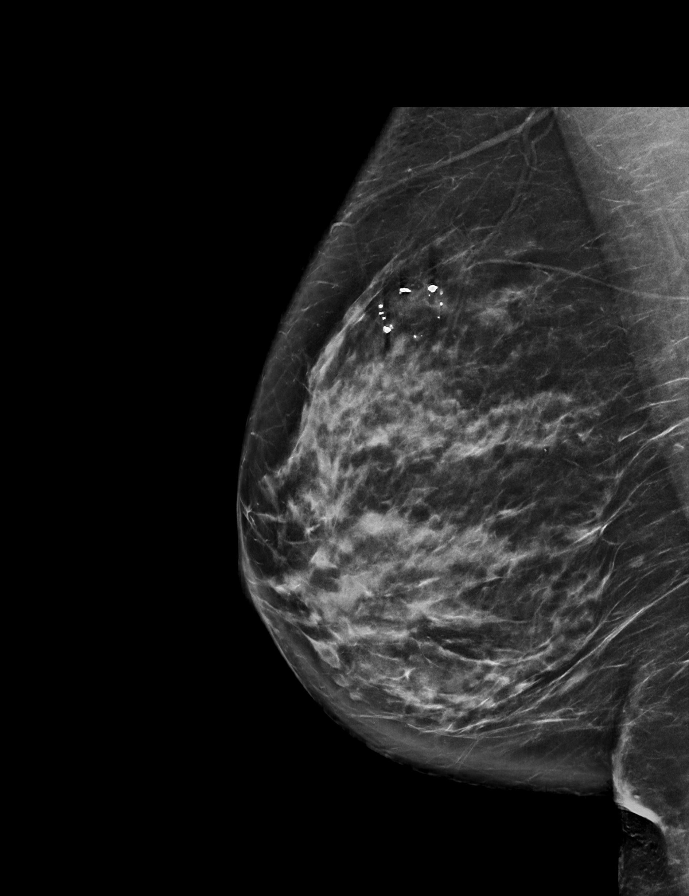

[L CC synth-2D]
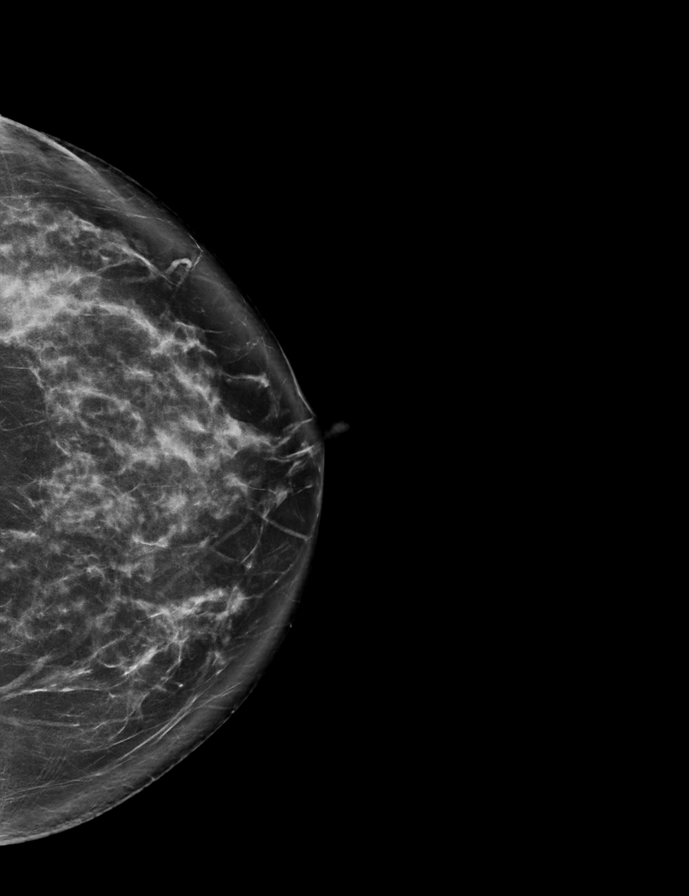

[R CC tomo · 2 of 76 frames shown]
[frame 25/76]
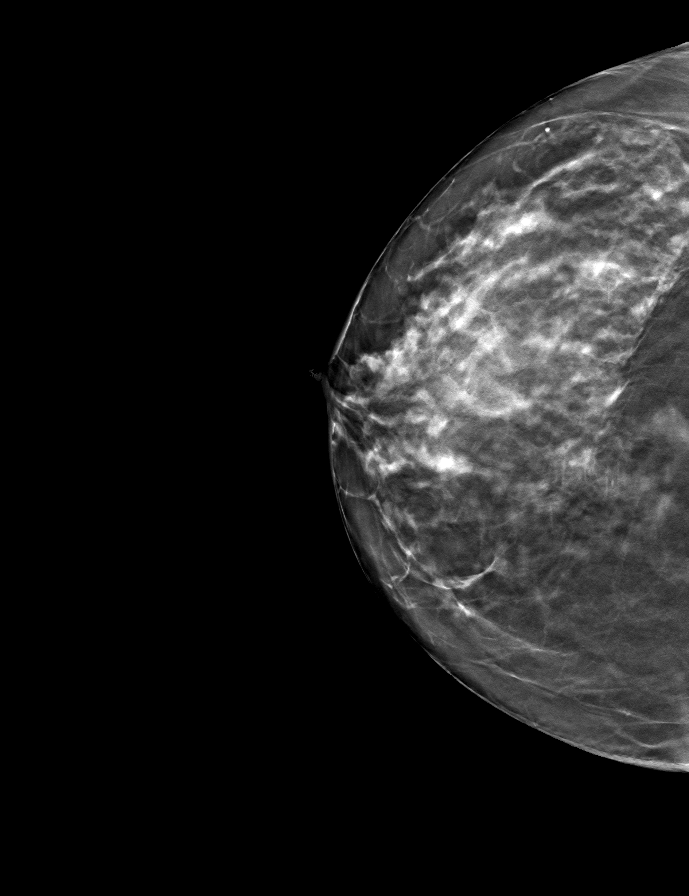
[frame 39/76]
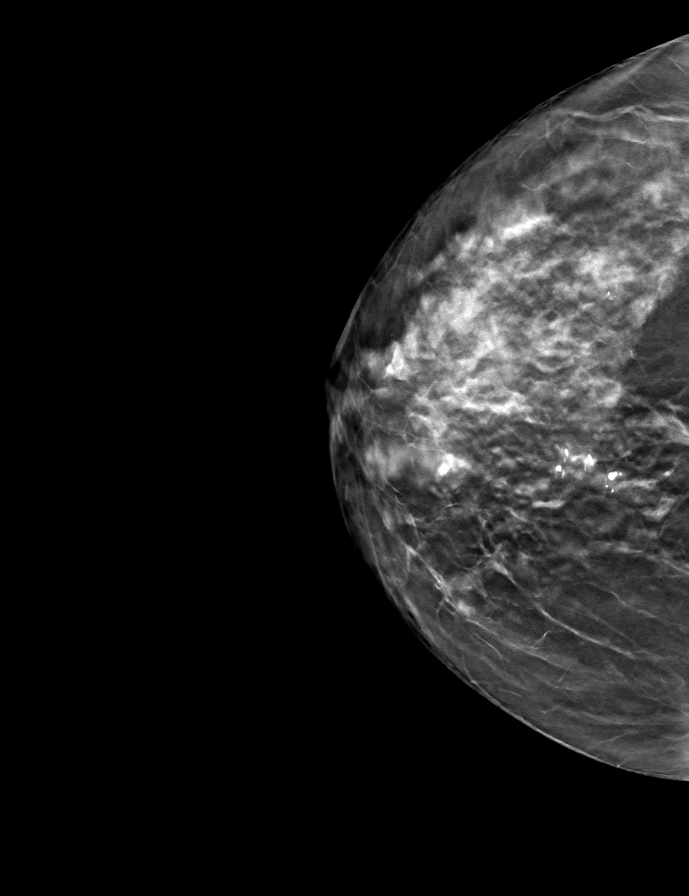

[L CC tomo · tomo slice 41/81.0]
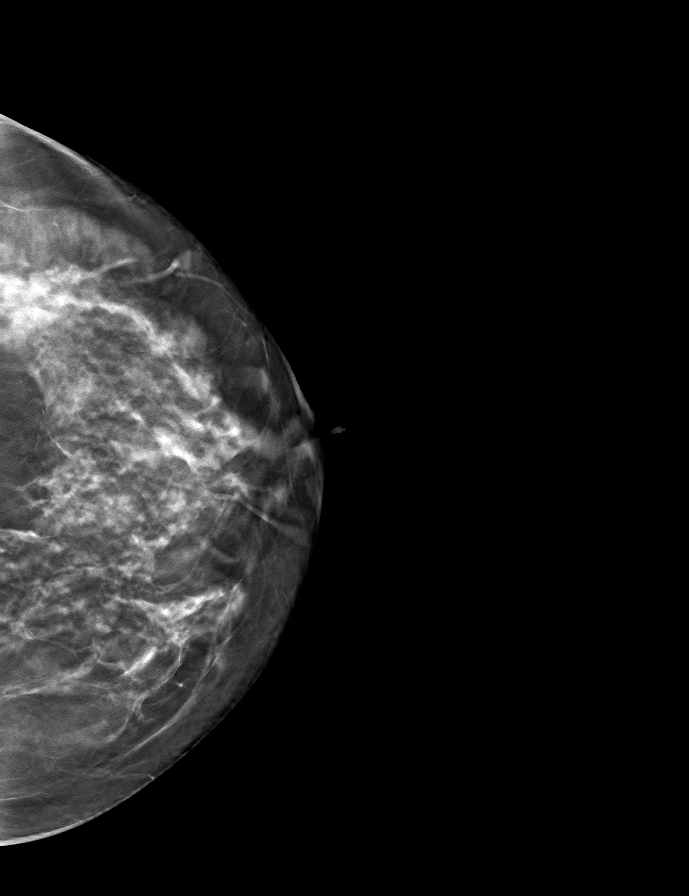

[R MLO tomo · tomo slice 41/80.0]
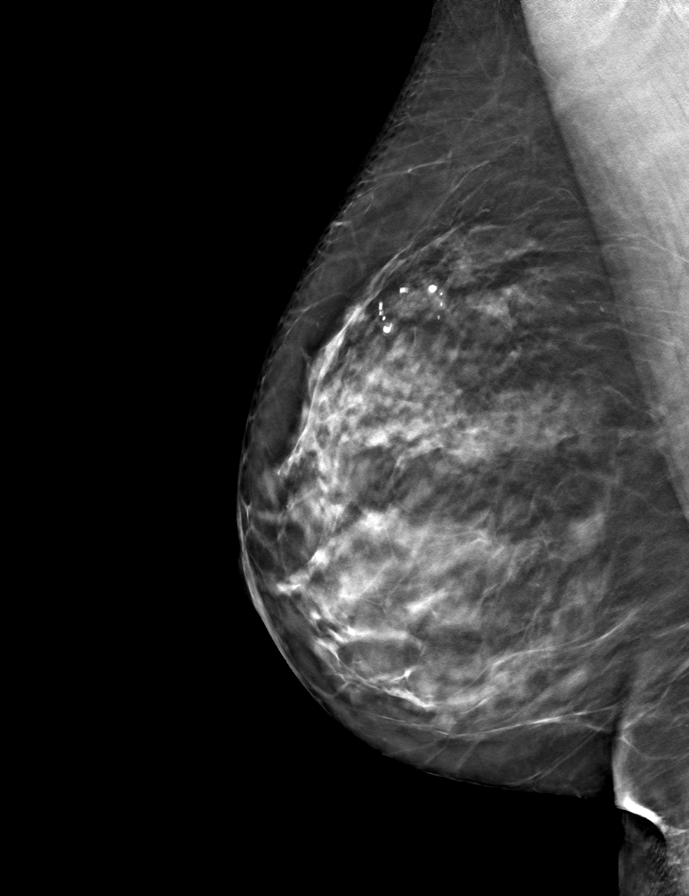

[L MLO tomo · tomo slice 42/83.0]
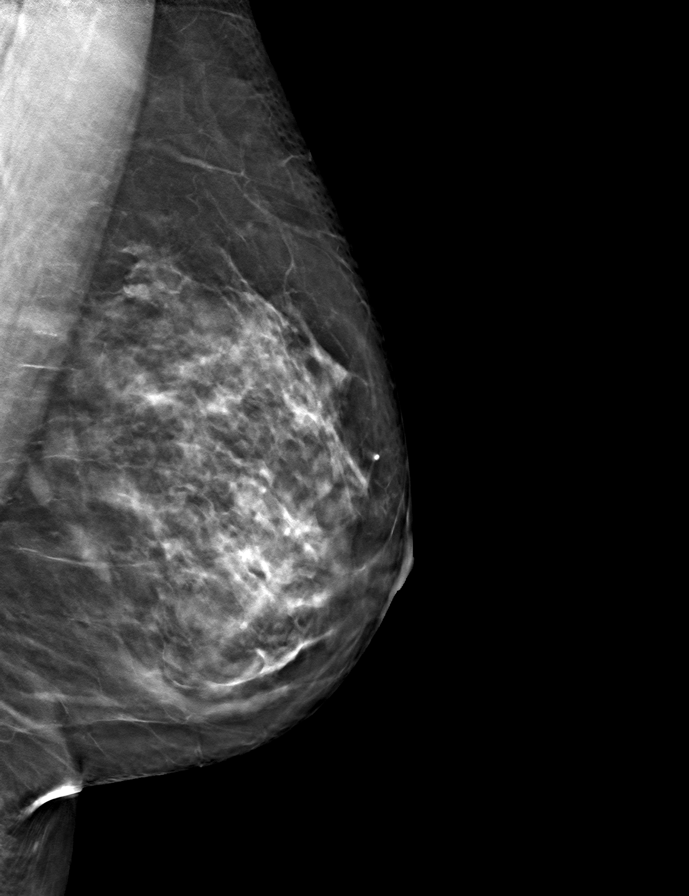

[9 of 24 positions shown; findings below may reference images not displayed]

ACR Breast Density Category c: The breast tissue is heterogeneously
dense, which may obscure small masses.
FINDINGS: There are no findings suspicious for malignancy.
IMPRESSION: No mammographic evidence of malignancy. A result letter of this
screening mammogram will be mailed directly to the patient.

RECOMMENDATION:
Screening mammogram in one year. (Code:[id number]-BC3)

BI-RADS CATEGORY  1: Negative.

## 2022-10-24 ENCOUNTER — Ambulatory Visit: Payer: BC Managed Care – PPO | Admitting: Sports Medicine

## 2022-10-24 VITALS — BP 111/61 | Ht 62.0 in | Wt 130.0 lb

## 2022-10-24 DIAGNOSIS — M7662 Achilles tendinitis, left leg: Secondary | ICD-10-CM

## 2022-10-24 NOTE — Progress Notes (Signed)
   Subjective:    Patient ID: Darnelle Maffucci, female    DOB: 05-31-1955, 67 y.o.   MRN: [id number]  HPI Chrystina presents today for follow-up on left Achilles tendinopathy.  Unfortunately she is continuing to have pain despite treatment with topical nitroglycerin, heel lifts, and home exercises.   Review of Systems As above    Objective:   Physical Exam  Left Achilles: There is tenderness and mild swelling in the mid substance of the Achilles tendon.  No palpable defect.  Good strength.      Assessment & Plan:   Left Achilles tendinopathy  We have decided to add soundwave treatment today.  First treatment performed as below.  She will continue with her topical nitroglycerin and heel lifts.  We will also refer her to renew physical therapy.  She will follow-up next week for her second soundwave treatment.  We discussed 4 weekly treatments to begin with.  Procedure: ECSWT Indications: Left Achilles tendinopathy   Procedure Details Consent: Risks of procedure as well as the alternatives and risks of each were explained to the patient.  Written consent for procedure obtained. Time Out: Verified patient identification, verified procedure, site was marked, verified correct patient position, medications/allergies/relevent history reviewed.  The area was cleaned with alcohol swab.     The left Achilles tendon was targeted for Extracorporeal shockwave therapy.    Preset: Achilles pain Power Level: 90 Frequency: 10 Impulse/cycles: 2000 Head size: Medium   Patient tolerated procedure well without immediate complications     This note was dictated using Dragon naturally speaking software and may contain errors in syntax, spelling, or content which have not been identified prior to signing this note.

## 2022-11-03 ENCOUNTER — Encounter: Payer: Self-pay | Admitting: Family Medicine

## 2022-11-03 ENCOUNTER — Ambulatory Visit (INDEPENDENT_AMBULATORY_CARE_PROVIDER_SITE_OTHER): Payer: Self-pay | Admitting: Family Medicine

## 2022-11-03 DIAGNOSIS — M7661 Achilles tendinitis, right leg: Secondary | ICD-10-CM

## 2022-11-03 DIAGNOSIS — M7662 Achilles tendinitis, left leg: Secondary | ICD-10-CM

## 2022-11-03 NOTE — Progress Notes (Signed)
Procedure: ECSWT Indications:  Left achilles pain   Patient is here for second soundwave therapy for her left Achilles pain.  Patient states that she definitely got better after the previous treatment.  Patient notes at least 20% improvement since last time.  Patient has been using the nitroglycerin patches as well.  Patient would like to continue with the shockwave therapies as she feels that she is getting benefit from them.  Patient does have an appointment for next Wednesday for her third treatment.  Procedure Details Consent: Risks of procedure as well as the alternatives and risks of each were explained to the patient.  Verbal consent for procedure obtained. Time Out: Verified patient identification, verified procedure, site was marked, verified correct patient position. The area was cleaned with alcohol swab.     The left achilles was targeted for Extracorporeal shockwave therapy.    Preset: Achilles Power Level: 100 Frequency: 12 Impulse/cycles: 2300 Head size: Medium   Patient tolerated procedure well without immediate complications.    This note was dictated using Dragon naturally speaking software and may contain errors in syntax, spelling, or content which have not been identified prior to signing this note.

## 2022-11-04 NOTE — Progress Notes (Signed)
[facility]: Attending Note: I agree with assessment and treatment plan as detailed in the Fellow's note.

## 2022-11-08 ENCOUNTER — Ambulatory Visit: Payer: BC Managed Care – PPO | Admitting: Family Medicine

## 2022-11-08 ENCOUNTER — Ambulatory Visit (INDEPENDENT_AMBULATORY_CARE_PROVIDER_SITE_OTHER): Payer: BC Managed Care – PPO | Admitting: Family Medicine

## 2022-11-08 ENCOUNTER — Encounter: Payer: Self-pay | Admitting: Family Medicine

## 2022-11-08 DIAGNOSIS — M7662 Achilles tendinitis, left leg: Secondary | ICD-10-CM

## 2022-11-08 NOTE — Progress Notes (Signed)
JOSIE LACSON - 67 y.o. female MRN [id number]  Date of birth: 1955/03/30  PCP: Pincus Sanes, MD  Subjective:  No chief complaint on file. Left Achilles tendinitis  HPI: Past Medical, Surgical, Social, and Family History Reviewed & Updated per EMR.   Patient is a 67 y.o. female here for follow up ECSWT.  She got some relief after the last visit with resolution of the pain but some persistent swelling tightness.  She wishes to proceed with today's therapy.      Objective:  Physical Exam: VS: BP:   HR: bpm  TEMP: ( )  RESP:   HT:    WT:   BMI:   Gen: NAD, speaks clearly, comfortable in exam room Respiratory: Normal respiratory effort on room air. No signs of distress Skin: No rashes, abrasions, or ecchymosis MSK: Slight nodularity to the distal one third of the left Achilles tendon proximal to the insertion point.   Mild edema deep to the tendon No overlying erythema, or warmth No tenderness to palpation Full plantarflexion/dorsiflexion with 5/5 strength    Assessment & Plan:   Left Achilles tendinitis Procedure: ECSWT Indications: Left Achilles tendinitis   Procedure Details Consent: Risks of procedure as well as the alternatives and risks of each were explained to the patient.  Written consent for procedure obtained. Time Out: Verified patient identification, verified procedure, site was marked, verified correct patient position, medications/allergies/relevent history reviewed.  The area was cleaned with alcohol swab.     The left Achilles tendon was targeted for Extracorporeal shockwave therapy.   Power Level: 120 MJ Frequency: 14 Hz Impulse/cycles: 2500  Head size: Medium   Patient tolerated procedure well without immediate complications.  We will see her in 1 week for her fourth visit.      Rica Mote MD [facility] Sports Medicine Fellow  Addendum:  Patient seen in the office by fellow.  His history, exam, plan of care were precepted with me.   Norton Blizzard MD Marrianne Mood

## 2022-11-08 NOTE — Assessment & Plan Note (Signed)
Procedure: ECSWT Indications: Left Achilles tendinitis   Procedure Details Consent: Risks of procedure as well as the alternatives and risks of each were explained to the patient.  Written consent for procedure obtained. Time Out: Verified patient identification, verified procedure, site was marked, verified correct patient position, medications/allergies/relevent history reviewed.  The area was cleaned with alcohol swab.     The left Achilles tendon was targeted for Extracorporeal shockwave therapy.   Power Level: 120 MJ Frequency: 14 Hz Impulse/cycles: 2500  Head size: Medium   Patient tolerated procedure well without immediate complications.  We will see her in 1 week for her fourth visit.

## 2022-11-16 ENCOUNTER — Ambulatory Visit (INDEPENDENT_AMBULATORY_CARE_PROVIDER_SITE_OTHER): Payer: Self-pay | Admitting: Sports Medicine

## 2022-11-16 DIAGNOSIS — M7662 Achilles tendinitis, left leg: Secondary | ICD-10-CM

## 2022-11-16 NOTE — Progress Notes (Signed)
PCP: Pincus Sanes, MD  SUBJECTIVE:   HPI:  Patient is a 67 y.o. female here for ECSWT for left achilles tendinopathy. Today is her 4th session. She notes about 50% improvement over the past 3 sessions. Not much lingering pain, but she does still complain of near constant tightness. She has been diligently doing her calf stretches.   ASSESSMENT & PLAN:  1. Left Achilles tendinitis  Procedure: ECSWT Indications:  Left Achilles Tendinitis   Procedure Details Consent: Risks of procedure as well as the alternatives and risks of each were explained to the patient.  Written consent for procedure obtained. Time Out: Verified patient identification, verified procedure, site was marked, verified correct patient position, medications/allergies/relevent history reviewed.  The area was cleaned with alcohol swab.     The left achilles tendon was targeted for Extracorporeal shockwave therapy.    Preset: Achillodynia Power Level: 120 mJ Frequency: 14 Hz Impulse/cycles: 2500 Head size: Medium   Patient tolerated procedure well without immediate complications. Anticipate she will need total of 5-6 treatments. F/u in 1 week.    Glean Salen, MD PGY-4, Sports Medicine Fellow [facility]  Addendum:  Patient seen in the office by fellow.  history, exam, plan of care were precepted with me.  Darene Lamer, DO, CAQSM

## 2022-11-24 ENCOUNTER — Ambulatory Visit: Payer: Self-pay | Admitting: Family Medicine

## 2022-11-27 DIAGNOSIS — M25579 Pain in unspecified ankle and joints of unspecified foot: Secondary | ICD-10-CM | POA: Diagnosis not present

## 2022-12-01 ENCOUNTER — Ambulatory Visit (INDEPENDENT_AMBULATORY_CARE_PROVIDER_SITE_OTHER): Payer: Self-pay | Admitting: Sports Medicine

## 2022-12-01 DIAGNOSIS — M7662 Achilles tendinitis, left leg: Secondary | ICD-10-CM

## 2022-12-01 MED ORDER — NITROGLYCERIN 0.2 MG/HR TD PT24: MEDICATED_PATCH | TRANSDERMAL | 1 refills | Status: DC

## 2022-12-01 NOTE — Progress Notes (Unsigned)
   PCP: Pincus Sanes, MD  SUBJECTIVE:   HPI:  Patient is a 67 y.o. female here for ECSWT for left achilles tendinopathy. Today is her 5th session. Following her last treatment two weeks ago she notes she was sore for about a week and wanted to wait another week before her next session. She is still at about 50% improvement. She is doing the NG protocol, inserts w/ heel lifts, and has been diligently doing her calf stretches both at home and with PT.   ASSESSMENT & PLAN:  1. Left Achilles tendinitis   Procedure: ECSWT, session 5 Indications:  Left Achilles Tendinitis   Procedure Details Consent: Risks of procedure as well as the alternatives and risks of each were explained to the patient.  Written consent for procedure obtained. Time Out: Verified patient identification, verified procedure, site was marked, verified correct patient position, medications/allergies/relevent history reviewed.  The area was cleaned with alcohol swab.     The left achilles tendon was targeted for Extracorporeal shockwave therapy.    Preset: Achillodynia Power Level: 100 mJ Frequency: 12 Hz Impulse/cycles: 2500 Head size: Large   Given her soreness with last session and pain on starting this session at and 14Hz  we reduced her settings back to and 12Hz . She tolerated procedure well without immediate complications. F/u in 1 week if noting benefit, otherwise may fall back on just continuing therapy exercises and NG protocol. Refill of NG patches provided.    Glean Salen, MD PGY-4, Sports Medicine Fellow [facility]  Addendum:  Patient seen in the office by fellow.  His history, exam, plan of care were precepted with me.  Norton Blizzard MD Marrianne Mood

## 2022-12-01 NOTE — Patient Instructions (Signed)
    Today you had ESWT (extracorporeal shock wave therapy).  This is a procedure that sends sound waves into your body to break up calcifications, scar tissue, and helps to stimulate healing processes.   ESWT is considered very safe with the major side effects being mild pain and sometimes bruising.  Many patients experience some benefit in the first 24 hours with better movement.  However, the benefits occur gradually over 5 to 7 days.  For most conditions ESWT requires at least 4 to 6 sessions and sometimes prolonged treatment administered once weekly.   Most insurances do not cover ESWT as it is a relatively new treatment for many conditions.  We try to keep the charge similar to a co-pay so as to lessen your out-of-pocket costs.   If you have more severe or progressive pain, fever, swelling or skin breakdown after a treatment you should call our office.

## 2022-12-04 DIAGNOSIS — M25579 Pain in unspecified ankle and joints of unspecified foot: Secondary | ICD-10-CM | POA: Diagnosis not present

## 2022-12-11 DIAGNOSIS — M25579 Pain in unspecified ankle and joints of unspecified foot: Secondary | ICD-10-CM | POA: Diagnosis not present

## 2022-12-18 DIAGNOSIS — M25579 Pain in unspecified ankle and joints of unspecified foot: Secondary | ICD-10-CM | POA: Diagnosis not present

## 2022-12-25 DIAGNOSIS — M25579 Pain in unspecified ankle and joints of unspecified foot: Secondary | ICD-10-CM | POA: Diagnosis not present

## 2022-12-30 DIAGNOSIS — M25579 Pain in unspecified ankle and joints of unspecified foot: Secondary | ICD-10-CM | POA: Diagnosis not present

## 2023-01-23 DIAGNOSIS — M9903 Segmental and somatic dysfunction of lumbar region: Secondary | ICD-10-CM | POA: Diagnosis not present

## 2023-01-25 DIAGNOSIS — M9903 Segmental and somatic dysfunction of lumbar region: Secondary | ICD-10-CM | POA: Diagnosis not present

## 2023-02-22 ENCOUNTER — Other Ambulatory Visit: Payer: Self-pay | Admitting: Sports Medicine

## 2023-03-12 DIAGNOSIS — H029 Unspecified disorder of eyelid: Secondary | ICD-10-CM | POA: Diagnosis not present

## 2023-05-11 DIAGNOSIS — H0288B Meibomian gland dysfunction left eye, upper and lower eyelids: Secondary | ICD-10-CM | POA: Diagnosis not present

## 2023-05-11 DIAGNOSIS — H04129 Dry eye syndrome of unspecified lacrimal gland: Secondary | ICD-10-CM | POA: Diagnosis not present

## 2023-05-11 DIAGNOSIS — H0288A Meibomian gland dysfunction right eye, upper and lower eyelids: Secondary | ICD-10-CM | POA: Diagnosis not present

## 2023-05-11 DIAGNOSIS — H25813 Combined forms of age-related cataract, bilateral: Secondary | ICD-10-CM | POA: Diagnosis not present

## 2023-05-17 DIAGNOSIS — H029 Unspecified disorder of eyelid: Secondary | ICD-10-CM | POA: Diagnosis not present

## 2023-06-15 ENCOUNTER — Other Ambulatory Visit: Payer: Self-pay | Admitting: Internal Medicine

## 2023-06-15 DIAGNOSIS — Z1231 Encounter for screening mammogram for malignant neoplasm of breast: Secondary | ICD-10-CM

## 2023-06-28 ENCOUNTER — Ambulatory Visit: Admitting: Family Medicine

## 2023-06-28 VITALS — BP 118/70 | Ht 62.0 in | Wt 120.0 lb

## 2023-06-28 DIAGNOSIS — M7662 Achilles tendinitis, left leg: Secondary | ICD-10-CM | POA: Diagnosis not present

## 2023-06-28 MED ORDER — NITROGLYCERIN 0.2 MG/HR TD PT24: MEDICATED_PATCH | TRANSDERMAL | 1 refills | Status: AC

## 2023-06-28 NOTE — Progress Notes (Addendum)
 PCP: Colene Dauphin, MD  Subjective:   HPI: Patient is a 68 y.o. female here for follow-up on left Achilles tendinopathy.  Underwent 5 sessions of shockwave therapy in the fall 2024.  Reports she had 70% improvement after that time but still with some swelling in the area.  Has been using nitro patches over the area with significant relief of symptoms.  Reports in the past month symptoms have worsened and she is now around 50-55% of usual function.  Very active at baseline, does elliptical at the gym.  Already doing heel lifts and calf strengthening exercises.  Wears heel lifts in her shoes.  Pain does not limit her activities at this time.   Past Medical History:  Diagnosis Date   Achilles bursitis or tendinitis 03/05/2008   Qualifier: Diagnosis of  By: Sulema Endo CMA, [place]     Anemia    GERD (gastroesophageal reflux disease)     Current Outpatient Medications on File Prior to Visit  Medication Sig Dispense Refill   albuterol  (VENTOLIN  HFA) 108 (90 Base) MCG/ACT inhaler TAKE 2 PUFFS BY MOUTH EVERY 6 HOURS AS NEEDED FOR WHEEZE OR SHORTNESS OF BREATH 8.5 each 1   blood glucose meter kit and supplies KIT Dispense based on patient and insurance preference. Use up to four times daily as directed. For hypoglycemia 1 each 0   doxycycline  (VIBRAMYCIN ) 50 MG capsule Take 50 mg by mouth every other day.     Epinastine HCl 0.05 % ophthalmic solution      famotidine  (PEPCID ) 20 MG tablet Take 1 tablet (20 mg total) by mouth 2 (two) times daily.     Ferrous Sulfate (IRON) 325 (65 FE) MG TABS Take 1 tablet by mouth daily.     nitroGLYCERIN  (NITRODUR - DOSED IN MG/24 HR) 0.2 mg/hr patch Use 1/4 patch daily to the affected area. 30 patch 1   predniSONE  (DELTASONE ) 10 MG tablet 3 tabs by mouth per day for 3 days,2tabs per day for 3 days,1tab per day for 3 days 18 tablet 0   XIIDRA 5 % SOLN Apply 1 drop to eye 2 (two) times daily.     No current facility-administered medications on file prior to visit.     Past Surgical History:  Procedure Laterality Date   ACL     CESAREAN SECTION     SHOULDER ARTHROSCOPY Right    TENDON REPAIR      Allergies  Allergen Reactions   Ampicillin Rash and Swelling    Swelling, red patches   Erythromycin Rash and Swelling    Swelling, red patches    BP 118/70   Ht 5\' 2"  (1.575 m)   Wt 120 lb (54.4 kg)   BMI 21.95 kg/m       No data to display              No data to display              Objective:  Physical Exam:  Gen: NAD, comfortable in exam room MSK: Left lower leg: -No erythema, ecchymosis or deformity noted -TTP over distal Achilles above insertion site.  No bony tenderness. -5/5 strength in dorsiflexion and plantarflexion -Full ROM of ankle without pain -Negative Thompson test   Assessment & Plan:  1.  Left Achilles tendinopathy: Recurrent, some benefit with conservative therapy and exercises but now symptomatic again.  Previous benefit with use ESWT x 5 sessions in the fall 2024, will repeat today and plan for 4-6 sessions.  Significant  benefit from nitro patches, refilled today.  Continue home exercises.   Procedure Details Consent: Risks of procedure as well as the alternatives and risks of each were explained to the patient.  Written consent for procedure obtained. Time Out: Verified patient identification, verified procedure, site was marked, verified correct patient position, medications/allergies/relevent history reviewed.  The area was cleaned with alcohol swab.     The left Achilles tendon was targeted for Extracorporeal shockwave therapy.    Preset: Achillodynia Power Level: 100 Frequency: 14 Hz Impulse/cycles: 2500 Head size: Large   Patient tolerated procedure well without immediate complications

## 2023-07-03 ENCOUNTER — Ambulatory Visit
Admission: RE | Admit: 2023-07-03 | Discharge: 2023-07-03 | Disposition: A | Discharge status: Home / Self Care | Source: Ambulatory Visit | Attending: Internal Medicine | Admitting: Internal Medicine

## 2023-07-03 DIAGNOSIS — Z1231 Encounter for screening mammogram for malignant neoplasm of breast: Secondary | ICD-10-CM | POA: Diagnosis not present

## 2023-07-24 ENCOUNTER — Ambulatory Visit (INDEPENDENT_AMBULATORY_CARE_PROVIDER_SITE_OTHER): Payer: Self-pay | Admitting: Family Medicine

## 2023-07-24 ENCOUNTER — Encounter: Payer: Self-pay | Admitting: Family Medicine

## 2023-07-24 DIAGNOSIS — M7662 Achilles tendinitis, left leg: Secondary | ICD-10-CM

## 2023-07-24 NOTE — Progress Notes (Signed)
 FOLLOW-UP VISIT:  Shockwave Therapy Procedure   NAME:  Heather Huynh DOB:  29-Dec-1955 MR#: [id number] DATE OF VISIT:  07/24/2023  Encounter:   Heather Huynh comes in for a follow up evaluation and 2nd Radial Extracorporeal Shockwave therapy (ESWT) treatment to the  Left achilles Last tx was 06/28/23 - helped initially, but now pain has returned Still using topical nitroglycerin  patches  Has some upcoming work travel - works as a Comptroller in the aerospace focus   ESWT Procedure Note: Treatment #2  Diagnosis: Lt achilles tendinopathy  Procedure Details  Consent for the procedure was obtained. Time out was performed. The anatomical landmarks and target areas for the procedure were identified.   PROCEDURE: ESWT was discussed with the patient in detail.  The risk and benefits were explained.  The point of maximal tenderness was identified and the oscillator probe was applied with moderate pressure. Treatment adjusted as mediated by patient feedback/pain.  Lt achilles was targeted for ESWT  Preset: Achillodynia  Power level: 14 MJ Frequency: 120 Hz Impulses: 2500 Head size: large   Complications:  None; patient tolerated the procedure well.  ASSESSMENT/PLAN: 1. Left Achilles tendinitis  Plan:   ESWT completed today as noted above Return in 1-2 week for ESWT procedure #3- has travel to [place]  next week for work Procedure aftercare reviewed Recommended avoiding strenuous activities and using OTC analgesia prn.

## 2023-07-24 NOTE — Patient Instructions (Signed)

## 2023-10-02 DIAGNOSIS — M1711 Unilateral primary osteoarthritis, right knee: Secondary | ICD-10-CM | POA: Diagnosis not present

## 2023-11-06 DIAGNOSIS — M1711 Unilateral primary osteoarthritis, right knee: Secondary | ICD-10-CM | POA: Diagnosis not present

## 2023-11-13 DIAGNOSIS — M1711 Unilateral primary osteoarthritis, right knee: Secondary | ICD-10-CM | POA: Diagnosis not present

## 2023-11-20 DIAGNOSIS — M1711 Unilateral primary osteoarthritis, right knee: Secondary | ICD-10-CM | POA: Diagnosis not present

## 2023-11-21 ENCOUNTER — Encounter: Payer: Self-pay | Admitting: Internal Medicine

## 2023-11-21 NOTE — Progress Notes (Unsigned)
 Subjective:    Patient ID: Heather Huynh, female    DOB: [birth year], 68 y.o.   MRN: [id number]      HPI Heather Huynh is here for a Physical exam and her chronic medical problems.    No changes since she was here last.  No concerns.     Medications and allergies reviewed with patient and updated if appropriate.  Current Outpatient Medications on File Prior to Visit  Medication Sig Dispense Refill   cholecalciferol (VITAMIN D3) 25 MCG (1000 UNIT) tablet Take 1,000 Units by mouth daily.     doxycycline  (VIBRAMYCIN ) 50 MG capsule Take 50 mg by mouth every other day.     Epinastine HCl 0.05 % ophthalmic solution      famotidine  (PEPCID ) 20 MG tablet Take 1 tablet (20 mg total) by mouth 2 (two) times daily.     Ferrous Sulfate (IRON) 325 (65 FE) MG TABS Take 1 tablet by mouth daily.     nitroGLYCERIN  (NITRODUR - DOSED IN MG/24 HR) 0.2 mg/hr patch Apply 1/4th patch to affected achilles, change daily 30 patch 1   XIIDRA 5 % SOLN Apply 1 drop to eye 2 (two) times daily.     No current facility-administered medications on file prior to visit.    Review of Systems  Constitutional:  Negative for fever.  Eyes:  Negative for visual disturbance.  Respiratory:  Negative for cough, shortness of breath and wheezing.   Cardiovascular:  Negative for chest pain, palpitations and leg swelling.  Gastrointestinal:  Negative for abdominal pain, blood in stool, constipation and diarrhea.       No gerd  Genitourinary:  Negative for dysuria.  Musculoskeletal:  Positive for arthralgias (right knee - mild OA). Negative for back pain.  Skin:  Negative for rash.  Neurological:  Negative for dizziness, light-headedness and headaches.  Psychiatric/Behavioral:  Negative for dysphoric mood and sleep disturbance. The patient is not nervous/anxious.        Objective:   Vitals:   11/22/23 0845  BP: 108/68  Pulse: 72  Temp: 98.3 F (36.8 C)  SpO2: 96%   Filed Weights   11/22/23 0845  Weight: 123  lb (55.8 kg)   Body mass index is 22.5 kg/m.  BP Readings from Last 3 Encounters:  11/22/23 108/68  06/28/23 118/70  10/24/22 111/61    Wt Readings from Last 3 Encounters:  11/22/23 123 lb (55.8 kg)  06/28/23 120 lb (54.4 kg)  10/24/22 130 lb (59 kg)       Physical Exam Constitutional: She appears well-developed and well-nourished. No distress.  HENT:  Head: Normocephalic and atraumatic.  Right Ear: External ear normal. Normal ear canal and TM Left Ear: External ear normal.  Normal ear canal and TM Mouth/Throat: Oropharynx is clear and moist.  Eyes: Conjunctivae normal.  Neck: Neck supple. No tracheal deviation present. No thyromegaly present.  No carotid bruit  Cardiovascular: Normal rate, regular rhythm and normal heart sounds.   No murmur heard.  No edema. Pulmonary/Chest: Effort normal and breath sounds normal. No respiratory distress. She has no wheezes. She has no rales.  Breast: deferred   Abdominal: Soft. She exhibits no distension. There is no tenderness.  Lymphadenopathy: She has no cervical adenopathy.  Skin: Skin is warm and dry. She is not diaphoretic.  Psychiatric: She has a normal mood and affect. Her behavior is normal.     Lab Results  Component Value Date   WBC 11.1 (H) 02/22/2021   HGB 13.4  02/22/2021   HCT 41.6 02/22/2021   PLT 323.0 02/22/2021   GLUCOSE 94 02/22/2021   ALT 19 02/22/2021   AST 21 02/22/2021   NA 138 02/22/2021   K 4.8 02/22/2021   CL 105 02/22/2021   CREATININE 1.12 02/22/2021   BUN 26 (H) 02/22/2021   CO2 28 02/22/2021   INR 1.0 02/22/2021   HGBA1C 5.9 02/22/2021         Assessment & Plan:   Physical exam: Screening blood work  ordered Exercise  4-6 times work out, did a Geophysical data processor Weight  normal Substance abuse  none   Reviewed recommended immunizations.   Health Maintenance  Topic Date Due   Medicare Annual Wellness (AWV)  Never done   DEXA SCAN  Never done   Colonoscopy  06/03/2023   Zoster  Vaccines- Shingrix (1 of 2) 02/22/2024 (Originally 10/24/2005)   Influenza Vaccine  05/13/2024 (Originally 09/14/2023)   Mammogram  07/02/2025   DTaP/Tdap/Td (3 - Td or Tdap) 12/03/2027   Pneumococcal Vaccine: 50+ Years  Completed   Hepatitis C Screening  Completed   Meningococcal B Vaccine  Aged Out   COVID-19 Vaccine  Discontinued          See Problem List for Assessment and Plan of chronic medical problems.

## 2023-11-21 NOTE — Patient Instructions (Addendum)
 Prevnar 20 pneumonia vaccine given   Blood work was ordered.       Medications changes include :   None    A referral was ordered gastro for a colonoscopy and someone will call you to schedule an appointment.    [facility] for colonoscopy Phone: [phone]    Return in about 1 year (around 11/21/2024) for Physical Exam.   Health Maintenance, Female Adopting a healthy lifestyle and getting preventive care are important in promoting health and wellness. Ask your health care provider about: The right schedule for you to have regular tests and exams. Things you can do on your own to prevent diseases and keep yourself healthy. What should I know about diet, weight, and exercise? Eat a healthy diet  Eat a diet that includes plenty of vegetables, fruits, low-fat dairy products, and lean protein. Do not eat a lot of foods that are high in solid fats, added sugars, or sodium. Maintain a healthy weight Body mass index (BMI) is used to identify weight problems. It estimates body fat based on height and weight. Your health care provider can help determine your BMI and help you achieve or maintain a healthy weight. Get regular exercise Get regular exercise. This is one of the most important things you can do for your health. Most adults should: Exercise for at least 150 minutes each week. The exercise should increase your heart rate and make you sweat (moderate-intensity exercise). Do strengthening exercises at least twice a week. This is in addition to the moderate-intensity exercise. Spend less time sitting. Even light physical activity can be beneficial. Watch cholesterol and blood lipids Have your blood tested for lipids and cholesterol at 68 years of age, then have this test every 5 years. Have your cholesterol levels checked more often if: Your lipid or cholesterol levels are high. You are older than 68 years of age. You are at high risk for heart disease. What should I know  about cancer screening? Depending on your health history and family history, you may need to have cancer screening at various ages. This may include screening for: Breast cancer. Cervical cancer. Colorectal cancer. Skin cancer. Lung cancer. What should I know about heart disease, diabetes, and high blood pressure? Blood pressure and heart disease High blood pressure causes heart disease and increases the risk of stroke. This is more likely to develop in people who have high blood pressure readings or are overweight. Have your blood pressure checked: Every 3-5 years if you are 91-61 years of age. Every year if you are 2 years old or older. Diabetes Have regular diabetes screenings. This checks your fasting blood sugar level. Have the screening done: Once every three years after age 16 if you are at a normal weight and have a low risk for diabetes. More often and at a younger age if you are overweight or have a high risk for diabetes. What should I know about preventing infection? Hepatitis B If you have a higher risk for hepatitis B, you should be screened for this virus. Talk with your health care provider to find out if you are at risk for hepatitis B infection. Hepatitis C Testing is recommended for: Everyone born from [birth year] through 1965. Anyone with known risk factors for hepatitis C. Sexually transmitted infections (STIs) Get screened for STIs, including gonorrhea and chlamydia, if: You are sexually active and are younger than 68 years of age. You are older than 68 years of age and your health care  provider tells you that you are at risk for this type of infection. Your sexual activity has changed since you were last screened, and you are at increased risk for chlamydia or gonorrhea. Ask your health care provider if you are at risk. Ask your health care provider about whether you are at high risk for HIV. Your health care provider may recommend a prescription medicine to help prevent  HIV infection. If you choose to take medicine to prevent HIV, you should first get tested for HIV. You should then be tested every 3 months for as long as you are taking the medicine. Pregnancy If you are about to stop having your period (premenopausal) and you may become pregnant, seek counseling before you get pregnant. Take 400 to 800 micrograms (mcg) of folic acid every day if you become pregnant. Ask for birth control (contraception) if you want to prevent pregnancy. Osteoporosis and menopause Osteoporosis is a disease in which the bones lose minerals and strength with aging. This can result in bone fractures. If you are 48 years old or older, or if you are at risk for osteoporosis and fractures, ask your health care provider if you should: Be screened for bone loss. Take a calcium or vitamin D supplement to lower your risk of fractures. Be given hormone replacement therapy (HRT) to treat symptoms of menopause. Follow these instructions at home: Alcohol use Do not drink alcohol if: Your health care provider tells you not to drink. You are pregnant, may be pregnant, or are planning to become pregnant. If you drink alcohol: Limit how much you have to: 0-1 drink a day. Know how much alcohol is in your drink. In the [place] one drink equals one 12 oz bottle of beer (355 mL), one 5 oz glass of wine (148 mL), or one 1 oz glass of hard liquor (44 mL). Lifestyle Do not use any products that contain nicotine or tobacco. These products include cigarettes, chewing tobacco, and vaping devices, such as e-cigarettes. If you need help quitting, ask your health care provider. Do not use street drugs. Do not share needles. Ask your health care provider for help if you need support or information about quitting drugs. General instructions Schedule regular health, dental, and eye exams. Stay current with your vaccines. Tell your health care provider if: You often feel depressed. You have ever been  abused or do not feel safe at home. Summary Adopting a healthy lifestyle and getting preventive care are important in promoting health and wellness. Follow your health care provider's instructions about healthy diet, exercising, and getting tested or screened for diseases. Follow your health care provider's instructions on monitoring your cholesterol and blood pressure. This information is not intended to replace advice given to you by your health care provider. Make sure you discuss any questions you have with your health care provider. Document Revised: 06/21/2020 Document Reviewed: 06/21/2020 Elsevier Patient Education  2024 [employer].

## 2023-11-22 ENCOUNTER — Encounter: Payer: Self-pay | Admitting: Internal Medicine

## 2023-11-22 ENCOUNTER — Ambulatory Visit (INDEPENDENT_AMBULATORY_CARE_PROVIDER_SITE_OTHER): Admitting: Internal Medicine

## 2023-11-22 ENCOUNTER — Ambulatory Visit: Payer: Self-pay | Admitting: Internal Medicine

## 2023-11-22 VITALS — BP 108/68 | HR 72 | Temp 98.3°F | Ht 62.0 in | Wt 123.0 lb

## 2023-11-22 DIAGNOSIS — R7303 Prediabetes: Secondary | ICD-10-CM

## 2023-11-22 DIAGNOSIS — Z0000 Encounter for general adult medical examination without abnormal findings: Secondary | ICD-10-CM | POA: Diagnosis not present

## 2023-11-22 DIAGNOSIS — M85851 Other specified disorders of bone density and structure, right thigh: Secondary | ICD-10-CM

## 2023-11-22 DIAGNOSIS — Z23 Encounter for immunization: Secondary | ICD-10-CM | POA: Diagnosis not present

## 2023-11-22 DIAGNOSIS — L718 Other rosacea: Secondary | ICD-10-CM

## 2023-11-22 DIAGNOSIS — R944 Abnormal results of kidney function studies: Secondary | ICD-10-CM

## 2023-11-22 DIAGNOSIS — Z1211 Encounter for screening for malignant neoplasm of colon: Secondary | ICD-10-CM

## 2023-11-22 DIAGNOSIS — K219 Gastro-esophageal reflux disease without esophagitis: Secondary | ICD-10-CM

## 2023-11-22 DIAGNOSIS — Z13820 Encounter for screening for osteoporosis: Secondary | ICD-10-CM

## 2023-11-22 DIAGNOSIS — E2839 Other primary ovarian failure: Secondary | ICD-10-CM

## 2023-11-22 LAB — HEMOGLOBIN A1C: Hgb A1c MFr Bld: 5.4 % (ref 4.6–6.5)

## 2023-11-22 LAB — CBC
HCT: 38.6 % (ref 36.0–46.0)
Hemoglobin: 12.8 g/dL (ref 12.0–15.0)
MCHC: 33.1 g/dL (ref 30.0–36.0)
MCV: 94.1 fl (ref 78.0–100.0)
Platelets: 256 K/uL (ref 150.0–400.0)
RBC: 4.11 Mil/uL (ref 3.87–5.11)
RDW: 13.6 % (ref 11.5–15.5)
WBC: 6.5 K/uL (ref 4.0–10.5)

## 2023-11-22 LAB — COMPREHENSIVE METABOLIC PANEL WITH GFR
ALT: 13 U/L (ref 0–35)
AST: 21 U/L (ref 0–37)
Albumin: 4.3 g/dL (ref 3.5–5.2)
Alkaline Phosphatase: 66 U/L (ref 39–117)
BUN: 15 mg/dL (ref 6–23)
CO2: 29 meq/L (ref 19–32)
Calcium: 9.1 mg/dL (ref 8.4–10.5)
Chloride: 107 meq/L (ref 96–112)
Creatinine, Ser: 0.78 mg/dL (ref 0.40–1.20)
GFR: 78.23 mL/min (ref >60.00)
Glucose, Bld: 95 mg/dL (ref 70–99)
Potassium: 4.3 meq/L (ref 3.5–5.1)
Sodium: 141 meq/L (ref 135–145)
Total Bilirubin: 0.5 mg/dL (ref 0.2–1.2)
Total Protein: 6.7 g/dL (ref 6.0–8.3)

## 2023-11-22 LAB — LIPID PANEL
Cholesterol: 198 mg/dL (ref 0–200)
HDL: 44.9 mg/dL (ref >39.00)
LDL Cholesterol: 138 mg/dL — ABNORMAL HIGH (ref 0–99)
NonHDL: 152.75
Total CHOL/HDL Ratio: 4
Triglycerides: 74 mg/dL (ref 0.0–149.0)
VLDL: 14.8 mg/dL (ref 0.0–40.0)

## 2023-11-22 LAB — TSH: TSH: 2.86 u[IU]/mL (ref 0.35–5.50)

## 2023-11-22 NOTE — Assessment & Plan Note (Signed)
 Chronic Controlled, stable Continue doxycycline  50 mg every other day

## 2023-11-22 NOTE — Assessment & Plan Note (Addendum)
Chronic GERD controlled Continue famotidine 20 mg daily 

## 2023-11-22 NOTE — Assessment & Plan Note (Addendum)
 Chronic For years has had issues with hypoglycemia Lab Results  Component Value Date   HGBA1C 5.9 02/22/2021   Check a1c, CMP, lipid panel, CBC, TSH Low sugar / carb diet Continue regular exercise

## 2023-11-22 NOTE — Assessment & Plan Note (Signed)
 Last CMP showed decreased GFR-no blood work since then [facility], CBC

## 2023-12-11 ENCOUNTER — Ambulatory Visit (INDEPENDENT_AMBULATORY_CARE_PROVIDER_SITE_OTHER)
Admission: RE | Admit: 2023-12-11 | Discharge: 2023-12-11 | Disposition: A | Discharge status: Home / Self Care | Source: Ambulatory Visit | Attending: Internal Medicine | Admitting: Internal Medicine

## 2023-12-11 DIAGNOSIS — Z13820 Encounter for screening for osteoporosis: Secondary | ICD-10-CM | POA: Diagnosis not present

## 2023-12-11 DIAGNOSIS — E2839 Other primary ovarian failure: Secondary | ICD-10-CM | POA: Diagnosis not present

## 2023-12-16 DIAGNOSIS — M8580 Other specified disorders of bone density and structure, unspecified site: Secondary | ICD-10-CM | POA: Insufficient documentation

## 2023-12-16 DIAGNOSIS — M810 Age-related osteoporosis without current pathological fracture: Secondary | ICD-10-CM | POA: Insufficient documentation

## 2023-12-19 NOTE — Patient Instructions (Incomplete)
   For the colonoscopy - GI -  Phone: [phone]      We will look into reclast infusion once a year.     Medications changes include :   You want to get approximately 1200 mg of calcium citrate a day in combination of supplements and food.  You should be taking 1000-2000 units of vitamin D daily.   Continue regular exercise

## 2023-12-19 NOTE — Progress Notes (Unsigned)
    Subjective:    Patient ID: Heather Huynh, female    DOB: 30-Jun-1955, 68 y.o.   MRN: [id number]      HPI Heather Huynh is here for No chief complaint on file.  Recent DEXA showed severe osteopenia with high FRAX.  Spine -1.4, RFN -2.0, LFN -2.4 FRAX 12.9%, 3% No prior DEXA for comparison   She is taking vitamin D daily.  She exercises 4-6 times a week.  ?  Family history of osteoporosis     Medications and allergies reviewed with patient and updated if appropriate.  Current Outpatient Medications on File Prior to Visit  Medication Sig Dispense Refill   cholecalciferol (VITAMIN D3) 25 MCG (1000 UNIT) tablet Take 1,000 Units by mouth daily.     doxycycline  (VIBRAMYCIN ) 50 MG capsule Take 50 mg by mouth every other day.     Epinastine HCl 0.05 % ophthalmic solution      famotidine  (PEPCID ) 20 MG tablet Take 1 tablet (20 mg total) by mouth 2 (two) times daily.     Ferrous Sulfate (IRON) 325 (65 FE) MG TABS Take 1 tablet by mouth daily.     nitroGLYCERIN  (NITRODUR - DOSED IN MG/24 HR) 0.2 mg/hr patch Apply 1/4th patch to affected achilles, change daily 30 patch 1   XIIDRA 5 % SOLN Apply 1 drop to eye 2 (two) times daily.     No current facility-administered medications on file prior to visit.    Review of Systems     Objective:  There were no vitals filed for this visit. BP Readings from Last 3 Encounters:  11/22/23 108/68  06/28/23 118/70  10/24/22 111/61   Wt Readings from Last 3 Encounters:  11/22/23 123 lb (55.8 kg)  06/28/23 120 lb (54.4 kg)  10/24/22 130 lb (59 kg)   There is no height or weight on file to calculate BMI.    Physical Exam         Assessment & Plan:    See Problem List for Assessment and Plan of chronic medical problems.

## 2023-12-20 ENCOUNTER — Ambulatory Visit (INDEPENDENT_AMBULATORY_CARE_PROVIDER_SITE_OTHER): Admitting: Internal Medicine

## 2023-12-20 ENCOUNTER — Encounter: Payer: Self-pay | Admitting: Internal Medicine

## 2023-12-20 ENCOUNTER — Telehealth: Payer: Self-pay

## 2023-12-20 VITALS — BP 104/72 | HR 89 | Temp 98.0°F | Ht 62.0 in | Wt 127.0 lb

## 2023-12-20 DIAGNOSIS — M810 Age-related osteoporosis without current pathological fracture: Secondary | ICD-10-CM | POA: Diagnosis not present

## 2023-12-20 DIAGNOSIS — M85852 Other specified disorders of bone density and structure, left thigh: Secondary | ICD-10-CM | POA: Diagnosis not present

## 2023-12-20 DIAGNOSIS — M85851 Other specified disorders of bone density and structure, right thigh: Secondary | ICD-10-CM | POA: Diagnosis not present

## 2023-12-20 NOTE — Telephone Encounter (Signed)
 Check on reclast today and have patient come back next week for lab drawl.

## 2023-12-20 NOTE — Assessment & Plan Note (Signed)
 New Severe osteopenia with high FRAX Family history of osteoporosis-Sister She exercises regularly and has done triathlons and will continue her vigorous exercise She is taking vitamin D, but will increase how much she is taking-advised 1000-2000 units daily Eating high calcium diet-continue, start calcium citrate 600 mg daily She has GERD so Fosamax is not an ideal option Start Reclast infusion yearly Check vitamin D level, BMP prior to request DEXA in 2 years

## 2023-12-31 ENCOUNTER — Telehealth: Payer: Self-pay

## 2023-12-31 NOTE — Telephone Encounter (Signed)
 Copied from CRM #[id number]. Topic: Clinical - Medication Question >> Dec 31, 2023 11:33 AM Heather Huynh wrote: Reason for CRM: Pt said she has rosacea and wants to know if Dr. Geofm thinks she needs a topical cream. Pls send to [facility] on [address].

## 2024-01-02 NOTE — Telephone Encounter (Signed)
 Patient schedule Monday 01/07/24 at 1:20 pm

## 2024-01-06 ENCOUNTER — Encounter: Payer: Self-pay | Admitting: Internal Medicine

## 2024-01-06 DIAGNOSIS — L719 Rosacea, unspecified: Secondary | ICD-10-CM | POA: Insufficient documentation

## 2024-01-06 NOTE — Progress Notes (Unsigned)
    Subjective:    Patient ID: Heather Huynh, female    DOB: [birth year], 68 y.o.   MRN: [id number]      HPI Heather Huynh is here for No chief complaint on file.    Brimonidine gel - daily, oxymetazoline  daily (for persistent facial erythema)  Ivermectin,  azelaic acid, and metronidazole  (for inflammatory papules and pustules)    Medications and allergies reviewed with patient and updated if appropriate.  Current Outpatient Medications on File Prior to Visit  Medication Sig Dispense Refill   cholecalciferol (VITAMIN D3) 25 MCG (1000 UNIT) tablet Take 1,000 Units by mouth daily.     doxycycline  (VIBRAMYCIN ) 50 MG capsule Take 50 mg by mouth every other day.     Epinastine HCl 0.05 % ophthalmic solution      famotidine  (PEPCID ) 20 MG tablet Take 1 tablet (20 mg total) by mouth 2 (two) times daily.     Ferrous Sulfate (IRON) 325 (65 FE) MG TABS Take 1 tablet by mouth daily.     nitroGLYCERIN  (NITRODUR - DOSED IN MG/24 HR) 0.2 mg/hr patch Apply 1/4th patch to affected achilles, change daily 30 patch 1   XIIDRA 5 % SOLN Apply 1 drop to eye 2 (two) times daily.     No current facility-administered medications on file prior to visit.    Review of Systems     Objective:  There were no vitals filed for this visit. BP Readings from Last 3 Encounters:  12/20/23 104/72  11/22/23 108/68  06/28/23 118/70   Wt Readings from Last 3 Encounters:  12/20/23 127 lb (57.6 kg)  11/22/23 123 lb (55.8 kg)  06/28/23 120 lb (54.4 kg)   There is no height or weight on file to calculate BMI.    Physical Exam         Assessment & Plan:    See Problem List for Assessment and Plan of chronic medical problems.

## 2024-01-07 ENCOUNTER — Ambulatory Visit (INDEPENDENT_AMBULATORY_CARE_PROVIDER_SITE_OTHER): Admitting: Internal Medicine

## 2024-01-07 ENCOUNTER — Ambulatory Visit: Payer: Self-pay | Admitting: Internal Medicine

## 2024-01-07 ENCOUNTER — Ambulatory Visit

## 2024-01-07 VITALS — BP 112/70 | HR 60 | Temp 98.0°F | Ht 62.0 in | Wt 124.0 lb

## 2024-01-07 DIAGNOSIS — M898X1 Other specified disorders of bone, shoulder: Secondary | ICD-10-CM | POA: Insufficient documentation

## 2024-01-07 DIAGNOSIS — L719 Rosacea, unspecified: Secondary | ICD-10-CM | POA: Diagnosis not present

## 2024-01-07 DIAGNOSIS — M25512 Pain in left shoulder: Secondary | ICD-10-CM | POA: Diagnosis not present

## 2024-01-07 DIAGNOSIS — M85851 Other specified disorders of bone density and structure, right thigh: Secondary | ICD-10-CM | POA: Insufficient documentation

## 2024-01-07 DIAGNOSIS — M810 Age-related osteoporosis without current pathological fracture: Secondary | ICD-10-CM

## 2024-01-07 LAB — BASIC METABOLIC PANEL WITH GFR
BUN: 15 mg/dL (ref 6–23)
CO2: 28 meq/L (ref 19–32)
Calcium: 9.5 mg/dL (ref 8.4–10.5)
Chloride: 106 meq/L (ref 96–112)
Creatinine, Ser: 0.87 mg/dL (ref 0.40–1.20)
GFR: 68.56 mL/min (ref >60.00)
Glucose, Bld: 86 mg/dL (ref 70–99)
Potassium: 4.2 meq/L (ref 3.5–5.1)
Sodium: 140 meq/L (ref 135–145)

## 2024-01-07 LAB — VITAMIN D 25 HYDROXY (VIT D DEFICIENCY, FRACTURES): VITD: 27.73 ng/mL — ABNORMAL LOW (ref 30.00–100.00)

## 2024-01-07 MED ORDER — METRONIDAZOLE 0.75 % EX GEL: 1.0000 | Freq: Two times a day (BID) | CUTANEOUS | 2 refills | Status: DC

## 2024-01-07 NOTE — Assessment & Plan Note (Signed)
 Acute Having left upper back-scapular pain for a while without injury that she is aware of Hurts more with certain movements of her arm Not getting better despite to rest and revision of activities Will get x-rays of ribs and scapula If no improvement may need to see sports medicine

## 2024-01-07 NOTE — Patient Instructions (Addendum)
   Have blood work done today.   [facility] for colonoscopy Phone: [phone]     Medications changes include :   metronidazole  gel

## 2024-01-07 NOTE — Assessment & Plan Note (Signed)
 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

## 2024-01-07 NOTE — Assessment & Plan Note (Signed)
 Chronic

## 2024-01-12 ENCOUNTER — Ambulatory Visit: Payer: Self-pay | Admitting: Internal Medicine

## 2024-01-12 ENCOUNTER — Encounter: Payer: Self-pay | Admitting: Internal Medicine

## 2024-01-14 ENCOUNTER — Telehealth: Payer: Self-pay

## 2024-01-14 NOTE — Telephone Encounter (Signed)
 This is regarding reclast -- has it been ordered?

## 2024-01-14 NOTE — Telephone Encounter (Signed)
 Copied from CRM [id number]. Topic: Referral - Status >> Jan 14, 2024 11:37 AM Corin V wrote: Reason for CRM: Patient was told during last office visit Dr. Geofm was going to refer her to a specialist for injections for bone growth. Please review and advise next steps and who will contact patient since labs were normal.   Callback: [phone]

## 2024-01-14 NOTE — Telephone Encounter (Signed)
 Faxed today

## 2024-01-15 ENCOUNTER — Other Ambulatory Visit (HOSPITAL_COMMUNITY): Payer: Self-pay | Admitting: Internal Medicine

## 2024-01-15 ENCOUNTER — Telehealth (HOSPITAL_COMMUNITY): Payer: Self-pay | Admitting: Pharmacy Technician

## 2024-01-15 DIAGNOSIS — M810 Age-related osteoporosis without current pathological fracture: Secondary | ICD-10-CM | POA: Insufficient documentation

## 2024-01-15 NOTE — Telephone Encounter (Signed)
 Auth Submission: NO AUTH NEEDED Site of care: [facility] Payer: [employer] MEDICARE Medication & CPT/J Code(s) submitted: Reclast (Zolendronic acid) [id number] Diagnosis Code: M81.0 Route of submission (phone, fax, portal):  Phone # Fax # Auth type: Buy/Bill HB Units/visits requested: 5mg  x 1 dose Reference number:  Approval from: 01/15/2024 to 03/15/24    Dagoberto Armour, CPhT [facility] Phone: [phone] 01/15/2024

## 2024-01-21 ENCOUNTER — Inpatient Hospital Stay (HOSPITAL_COMMUNITY)
Admission: RE | Admit: 2024-01-21 | Discharge: 2024-01-21 | Disposition: A | Source: Ambulatory Visit | Attending: Internal Medicine

## 2024-01-21 VITALS — BP 155/73 | HR 80 | Temp 97.5°F | Resp 16 | Wt 129.0 lb

## 2024-01-21 DIAGNOSIS — M810 Age-related osteoporosis without current pathological fracture: Secondary | ICD-10-CM | POA: Diagnosis not present

## 2024-01-21 MED ORDER — SODIUM CHLORIDE 0.9 % IV SOLN: INTRAVENOUS | Status: DC

## 2024-01-21 MED ORDER — ACETAMINOPHEN 325 MG PO TABS: 650.0000 mg | ORAL_TABLET | Freq: Once | ORAL | Status: DC

## 2024-01-21 MED ORDER — DIPHENHYDRAMINE HCL 25 MG PO CAPS: 25.0000 mg | ORAL_CAPSULE | Freq: Once | ORAL | Status: DC

## 2024-01-21 MED ORDER — ZOLEDRONIC ACID 5 MG/100ML IV SOLN
5.0000 mg | Freq: Once | INTRAVENOUS | Status: AC
  Administered 2024-01-21: 5 mg via INTRAVENOUS

## 2024-01-21 MED ORDER — ZOLEDRONIC ACID 5 MG/100ML IV SOLN
INTRAVENOUS | Status: AC
  Filled 2024-01-21: qty 100

## 2024-02-19 ENCOUNTER — Encounter: Payer: Self-pay | Admitting: Internal Medicine

## 2024-02-19 ENCOUNTER — Ambulatory Visit: Admitting: Internal Medicine

## 2024-02-19 VITALS — BP 120/70 | HR 65 | Temp 97.5°F | Ht 62.0 in | Wt 127.6 lb

## 2024-02-19 DIAGNOSIS — M542 Cervicalgia: Secondary | ICD-10-CM

## 2024-02-19 MED ORDER — CYCLOBENZAPRINE HCL 5 MG PO TABS: 5.0000 mg | ORAL_TABLET | Freq: Three times a day (TID) | ORAL | 1 refills | Status: AC | PRN

## 2024-02-19 MED ORDER — METHYLPREDNISOLONE ACETATE 40 MG/ML IJ SUSP
40.0000 mg | Freq: Once | INTRAMUSCULAR | Status: AC
  Administered 2024-02-19: 40 mg via INTRAMUSCULAR

## 2024-02-19 MED ORDER — KETOROLAC TROMETHAMINE 30 MG/ML IJ SOLN
30.0000 mg | Freq: Once | INTRAMUSCULAR | Status: AC
  Administered 2024-02-19: 30 mg via INTRAMUSCULAR

## 2024-02-19 NOTE — Progress Notes (Signed)
 "  Subjective:   Patient ID: Heather Huynh, female    DOB: 1955/09/05, 69 y.o.   MRN: [id number]  Discussed the use of AI scribe software for clinical note transcription with the patient, who gave verbal consent to proceed.  History of Present Illness Heather Huynh is a 69 year old female who presents with neck pain.  She has been experiencing tightness in her neck since last Friday, initially attributing it to muscle tension from a workout. The pain intensified after a bike ride on Sunday, becoming severe by Monday. It is localized to one spot in her neck, radiating slightly into her scalp, but not down her arm.  The pain worsens with certain neck movements, particularly when turning to the left or hyperextending backward. Relief is found when lying down in a specific position, allowing her to sleep without disturbance.  She has tried ibuprofen and using a Theragun for relief, but neither provided significant improvement. No recent trauma, such as car accidents or lifting heavy objects.  Her exercise routine includes working out five days a week, and she speculates that a recent workout involving weights may have contributed to her neck strain. No numbness, tingling, or weakness in her arms.  Review of Systems  Constitutional: Negative.   HENT: Negative.    Eyes: Negative.   Respiratory:  Negative for cough, chest tightness and shortness of breath.   Cardiovascular:  Negative for chest pain, palpitations and leg swelling.  Gastrointestinal:  Negative for abdominal distention, abdominal pain, constipation, diarrhea, nausea and vomiting.  Musculoskeletal:  Positive for neck pain.  Skin: Negative.   Neurological: Negative.   Psychiatric/Behavioral: Negative.      Objective:  Physical Exam Constitutional:      Appearance: She is well-developed.  HENT:     Head: Normocephalic and atraumatic.  Cardiovascular:     Rate and Rhythm: Normal rate and regular rhythm.  Pulmonary:      Effort: Pulmonary effort is normal. No respiratory distress.     Breath sounds: Normal breath sounds. No wheezing or rales.  Abdominal:     General: Bowel sounds are normal. There is no distension.     Palpations: Abdomen is soft.     Tenderness: There is no abdominal tenderness.  Musculoskeletal:        General: Tenderness present.     Cervical back: Normal range of motion.     Comments: Pain left adjacent to midline but no tenderness over spine, some pain on extension of neck and leftward gaze  Skin:    General: Skin is warm and dry.  Neurological:     Mental Status: She is alert and oriented to person, place, and time.     Coordination: Coordination normal.     Vitals:   02/19/24 1255  BP: 120/70  Pulse: 65  Temp: (!) 97.5 F (36.4 C)  TempSrc: Oral  SpO2: 99%  Weight: 127 lb 9.6 oz (57.9 kg)  Height: 5' 2 (1.575 m)    Assessment and Plan Assessment & Plan Acute neck pain   Pain is localized to the neck and scalp, likely due to muscular strain or sprain. Absence of arm symptoms suggests nerve impingement is unlikely. Administered depo-medrol  40 mg IM and Toradol  30 mg IM injections for anti-inflammatory effect and immediate pain relief, respectively. Prescribed muscle relaxers flexeril  5 mg TID prn. Advised to reduce weight lifting by 30-50% for two weeks. Biking and leg exercises are allowed, avoiding neck strain. Instructed to monitor for worsening  or new neurological symptoms.   "

## 2024-02-19 NOTE — Patient Instructions (Signed)
 We have given you the steroid and toradol  shot today to help the neck. The toradol  should kick in within 30 minutes and last 6-8 hours.  The steroid shot will take 2-3 days to kick in to help.  We have also sent in flexeril  (cyclobenzaprine ) which is a muscle relaxer to use up to 3 times a day as needed for pain. It is okay to take tylenol  or ibuprofen with this.

## 2024-02-25 ENCOUNTER — Encounter

## 2024-02-25 VITALS — Ht 62.0 in | Wt 120.0 lb

## 2024-02-25 DIAGNOSIS — Z1211 Encounter for screening for malignant neoplasm of colon: Secondary | ICD-10-CM

## 2024-02-25 MED ORDER — NA SULFATE-K SULFATE-MG SULF 17.5-3.13-1.6 GM/177ML PO SOLN: 1.0000 | Freq: Once | ORAL | 0 refills | Status: AC

## 2024-02-25 NOTE — Progress Notes (Signed)
 No egg or soy allergy known to patient  No issues known to pt with past sedation with any surgeries or procedures- POST-OP NAUSEA  Patient denies ever being told they had issues or difficulty with intubation  No FH of Malignant Hyperthermia Pt is not on diet pills Pt is not on  home 02  Pt is not on blood thinners  Pt denies issues with constipation  No A fib or A flutter Have any cardiac testing pending--NO Pt can ambulate-INDEPENDENTLY  Pt denies use of chewing tobacco Discussed diabetic I weight loss medication holds Discussed NSAID holds Checked BMI Pt instructed to use Singlecare.com or GoodRx for a price reduction on prep  Patient's chart reviewed by Norleen Schillings CNRA prior to previsit and patient appropriate for the [facility].  Pre visit completed and red dot placed by patient's name on their procedure day (on provider's schedule).

## 2024-02-28 ENCOUNTER — Encounter: Payer: Self-pay | Admitting: Internal Medicine

## 2024-03-03 ENCOUNTER — Encounter: Admitting: Internal Medicine

## 2024-03-04 ENCOUNTER — Telehealth: Payer: Self-pay | Admitting: Internal Medicine

## 2024-03-04 NOTE — Telephone Encounter (Signed)
 Good afternoon Dr. Albertus,    I received a call from this patient stating that she is would like to reschedule her appointment set for January the 26 th due to the inclement weather. Patient was reschedule for February the 20 th. Please advise.     Thank you

## 2024-03-05 ENCOUNTER — Encounter: Payer: Self-pay | Admitting: Internal Medicine

## 2024-03-07 ENCOUNTER — Telehealth: Payer: Self-pay

## 2024-03-07 DIAGNOSIS — L719 Rosacea, unspecified: Secondary | ICD-10-CM

## 2024-03-07 NOTE — Telephone Encounter (Signed)
 Message sent to Dr. Geofm via my-chart to address.

## 2024-03-07 NOTE — Telephone Encounter (Signed)
 Copied from CRM [id number]. Topic: Clinical - Medication Question >> Mar 07, 2024 11:19 AM Charolett L wrote: Reason for CRM: Patient sent a message on mychart and requesting a call back in reference to medication not being strong enough

## 2024-03-10 ENCOUNTER — Encounter: Admitting: Internal Medicine

## 2024-03-11 MED ORDER — OXYMETAZOLINE HCL 1 % EX CREA: TOPICAL_CREAM | CUTANEOUS | 2 refills | Status: AC

## 2024-03-11 NOTE — Addendum Note (Signed)
 Addended by: GEOFM GLADE PARAS on: 03/11/2024 03:52 PM   Modules accepted: Orders

## 2024-03-11 NOTE — Telephone Encounter (Signed)
 Patient has called back regarding medication metroNIDAZOLE  (METROGEL ) 0.75 % gel . States she is having an out break still for 10 days and wants to know what to do. Please call back to discuss.   Phone number: [phone]

## 2024-03-11 NOTE — Telephone Encounter (Signed)
 There are a few other creams that we can try, but it may not be covered by insurance.  I sent a new prescription to your pharmacy.

## 2024-04-04 ENCOUNTER — Encounter: Admitting: Internal Medicine
